# Patient Record
Sex: Female | Born: 1985 | Race: White | Hispanic: No | Marital: Single | Smoking: Current every day smoker
Health system: Southern US, Community
[De-identification: ages and names within clinical notes are randomized; demographics above are authoritative.]

## PROBLEM LIST (undated history)

## (undated) ENCOUNTER — Inpatient Hospital Stay: Payer: Self-pay

## (undated) DIAGNOSIS — F419 Anxiety disorder, unspecified: Secondary | ICD-10-CM

## (undated) DIAGNOSIS — F329 Major depressive disorder, single episode, unspecified: Secondary | ICD-10-CM

## (undated) DIAGNOSIS — O99323 Drug use complicating pregnancy, third trimester: Secondary | ICD-10-CM

## (undated) DIAGNOSIS — F32A Depression, unspecified: Secondary | ICD-10-CM

## (undated) DIAGNOSIS — F99 Mental disorder, not otherwise specified: Secondary | ICD-10-CM

## (undated) HISTORY — PX: NO PAST SURGERIES: SHX2092

---
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
## 2005-02-26 ENCOUNTER — Emergency Department: Payer: Self-pay | Admitting: Emergency Medicine

## 2006-06-25 ENCOUNTER — Emergency Department (HOSPITAL_COMMUNITY): Admission: EM | Admit: 2006-06-25 | Discharge: 2006-06-25 | Payer: Self-pay | Admitting: Emergency Medicine

## 2006-12-20 ENCOUNTER — Emergency Department: Payer: Self-pay | Admitting: Emergency Medicine

## 2007-04-10 ENCOUNTER — Emergency Department: Payer: Self-pay | Admitting: Emergency Medicine

## 2007-06-04 ENCOUNTER — Emergency Department (HOSPITAL_COMMUNITY): Admission: EM | Admit: 2007-06-04 | Discharge: 2007-06-04 | Payer: Self-pay | Admitting: Emergency Medicine

## 2007-06-09 ENCOUNTER — Emergency Department: Payer: Self-pay | Admitting: Emergency Medicine

## 2008-08-29 ENCOUNTER — Emergency Department: Payer: Self-pay | Admitting: Emergency Medicine

## 2011-01-23 ENCOUNTER — Emergency Department: Payer: Self-pay | Admitting: Internal Medicine

## 2011-01-27 ENCOUNTER — Emergency Department: Payer: Self-pay | Admitting: Emergency Medicine

## 2011-06-30 ENCOUNTER — Inpatient Hospital Stay: Payer: Self-pay | Admitting: Obstetrics and Gynecology

## 2011-07-08 ENCOUNTER — Emergency Department (HOSPITAL_COMMUNITY)
Admission: EM | Admit: 2011-07-08 | Discharge: 2011-07-09 | Disposition: A | Discharge status: Still In Hospital | Payer: Self-pay | Attending: Emergency Medicine | Admitting: Emergency Medicine

## 2011-07-08 DIAGNOSIS — R109 Unspecified abdominal pain: Secondary | ICD-10-CM | POA: Insufficient documentation

## 2011-07-08 DIAGNOSIS — N739 Female pelvic inflammatory disease, unspecified: Secondary | ICD-10-CM | POA: Insufficient documentation

## 2011-07-08 DIAGNOSIS — N898 Other specified noninflammatory disorders of vagina: Secondary | ICD-10-CM | POA: Insufficient documentation

## 2011-07-08 DIAGNOSIS — Z87891 Personal history of nicotine dependence: Secondary | ICD-10-CM | POA: Insufficient documentation

## 2011-07-08 DIAGNOSIS — R079 Chest pain, unspecified: Secondary | ICD-10-CM | POA: Insufficient documentation

## 2011-07-08 DIAGNOSIS — F1911 Other psychoactive substance abuse, in remission: Secondary | ICD-10-CM | POA: Insufficient documentation

## 2011-07-08 DIAGNOSIS — Z79899 Other long term (current) drug therapy: Secondary | ICD-10-CM | POA: Insufficient documentation

## 2011-07-08 LAB — COMPREHENSIVE METABOLIC PANEL
ALT: 37 U/L — ABNORMAL HIGH (ref 0–35)
AST: 36 U/L (ref 0–37)
Albumin: 3.8 g/dL (ref 3.5–5.2)
Alkaline Phosphatase: 93 U/L (ref 39–117)
Calcium: 10.5 mg/dL (ref 8.4–10.5)
GFR calc Af Amer: 52 mL/min — ABNORMAL LOW (ref >60)
Glucose, Bld: 93 mg/dL (ref 70–99)
Potassium: 4 mEq/L (ref 3.5–5.1)
Sodium: 139 mEq/L (ref 135–145)
Total Protein: 7.9 g/dL (ref 6.0–8.3)

## 2011-07-08 LAB — POCT PREGNANCY, URINE: Preg Test, Ur: NEGATIVE

## 2011-07-08 LAB — URINALYSIS, ROUTINE W REFLEX MICROSCOPIC
Bilirubin Urine: NEGATIVE
Ketones, ur: NEGATIVE mg/dL
Nitrite: NEGATIVE
Specific Gravity, Urine: 1.009 (ref 1.005–1.030)
pH: 7 (ref 5.0–8.0)

## 2011-07-08 LAB — POCT I-STAT TROPONIN I: Troponin i, poc: 0 ng/mL (ref 0.00–0.08)

## 2011-07-08 LAB — CBC
MCH: 29.7 pg (ref 26.0–34.0)
MCHC: 34.6 g/dL (ref 30.0–36.0)
Platelets: 525 10*3/uL — ABNORMAL HIGH (ref 150–400)
RDW: 12.9 % (ref 11.5–15.5)

## 2011-07-08 LAB — URINE MICROSCOPIC-ADD ON

## 2011-07-09 ENCOUNTER — Emergency Department (HOSPITAL_COMMUNITY): Payer: Self-pay

## 2011-07-09 ENCOUNTER — Encounter (HOSPITAL_COMMUNITY): Payer: Self-pay | Admitting: *Deleted

## 2011-07-09 ENCOUNTER — Inpatient Hospital Stay (HOSPITAL_COMMUNITY)
Admission: AD | Admit: 2011-07-09 | Discharge: 2011-07-13 | DRG: 759 | Disposition: A | Discharge status: Home / Self Care | Payer: Self-pay | Source: Ambulatory Visit | Attending: Obstetrics & Gynecology | Admitting: Obstetrics & Gynecology

## 2011-07-09 ENCOUNTER — Inpatient Hospital Stay (HOSPITAL_COMMUNITY): Payer: Self-pay

## 2011-07-09 DIAGNOSIS — A5619 Other chlamydial genitourinary infection: Secondary | ICD-10-CM | POA: Diagnosis present

## 2011-07-09 DIAGNOSIS — A5901 Trichomonal vulvovaginitis: Secondary | ICD-10-CM | POA: Diagnosis present

## 2011-07-09 DIAGNOSIS — N739 Female pelvic inflammatory disease, unspecified: Principal | ICD-10-CM | POA: Diagnosis present

## 2011-07-09 DIAGNOSIS — N949 Unspecified condition associated with female genital organs and menstrual cycle: Secondary | ICD-10-CM | POA: Diagnosis present

## 2011-07-09 LAB — WET PREP, GENITAL: Yeast Wet Prep HPF POC: NONE SEEN

## 2011-07-09 LAB — DIFFERENTIAL
Basophils Absolute: 0 10*3/uL (ref 0.0–0.1)
Eosinophils Absolute: 0.2 10*3/uL (ref 0.0–0.7)
Lymphs Abs: 4.2 10*3/uL — ABNORMAL HIGH (ref 0.7–4.0)
Monocytes Absolute: 2 10*3/uL — ABNORMAL HIGH (ref 0.1–1.0)
Neutro Abs: 15.9 10*3/uL — ABNORMAL HIGH (ref 1.7–7.7)

## 2011-07-09 LAB — CK TOTAL AND CKMB (NOT AT ARMC): Relative Index: INVALID (ref 0.0–2.5)

## 2011-07-09 MED ORDER — DOXYCYCLINE HYCLATE 100 MG IV SOLR
100.0000 mg | Freq: Two times a day (BID) | INTRAVENOUS | Status: DC
  Administered 2011-07-09: 100 mg via INTRAVENOUS
  Filled 2011-07-09: qty 100

## 2011-07-09 MED ORDER — SENNOSIDES-DOCUSATE SODIUM 8.6-50 MG PO TABS: 2.0000 | ORAL_TABLET | Freq: Every day | ORAL | Status: DC | PRN

## 2011-07-09 MED ORDER — BISACODYL 10 MG RE SUPP: 10.0000 mg | Freq: Every day | RECTAL | Status: DC | PRN

## 2011-07-09 MED ORDER — DOCUSATE SODIUM 100 MG PO CAPS
100.0000 mg | ORAL_CAPSULE | Freq: Every day | ORAL | Status: DC
  Administered 2011-07-09 – 2011-07-12 (×4): 100 mg via ORAL
  Filled 2011-07-09 (×4): qty 1

## 2011-07-09 MED ORDER — TECHNETIUM TO 99M ALBUMIN AGGREGATED
6.0000 | Freq: Once | INTRAVENOUS | Status: AC | PRN
  Administered 2011-07-09: 6 via INTRAVENOUS

## 2011-07-09 MED ORDER — CLINDAMYCIN PHOSPHATE 900 MG/50ML IV SOLN
900.0000 mg | Freq: Three times a day (TID) | INTRAVENOUS | Status: DC
  Administered 2011-07-09 – 2011-07-12 (×11): 900 mg via INTRAVENOUS
  Filled 2011-07-09 (×13): qty 50

## 2011-07-09 MED ORDER — INFLUENZA VIRUS VACC SPLIT PF IM SUSP
0.5000 mL | Freq: Once | INTRAMUSCULAR | Status: AC
  Administered 2011-07-10: 0.5 mL via INTRAMUSCULAR
  Filled 2011-07-09: qty 0.5

## 2011-07-09 MED ORDER — NICOTINE 14 MG/24HR TD PT24
14.0000 mg | MEDICATED_PATCH | Freq: Every day | TRANSDERMAL | Status: DC
  Administered 2011-07-09 – 2011-07-12 (×4): 14 mg via TRANSDERMAL
  Filled 2011-07-09 (×5): qty 1

## 2011-07-09 MED ORDER — IBUPROFEN 800 MG PO TABS
800.0000 mg | ORAL_TABLET | Freq: Three times a day (TID) | ORAL | Status: DC | PRN
  Administered 2011-07-11 – 2011-07-12 (×2): 800 mg via ORAL
  Filled 2011-07-09 (×2): qty 1

## 2011-07-09 MED ORDER — GENTAMICIN SULFATE 40 MG/ML IJ SOLN
100.0000 mg | INTRAVENOUS | Status: DC
  Administered 2011-07-09 – 2011-07-10 (×2): 100 mg via INTRAVENOUS
  Filled 2011-07-09 (×3): qty 2.5

## 2011-07-09 MED ORDER — DOXYCYCLINE HYCLATE 100 MG IV SOLR
100.0000 mg | Freq: Two times a day (BID) | INTRAVENOUS | Status: DC
  Filled 2011-07-09: qty 100

## 2011-07-09 MED ORDER — XENON XE 133 GAS
10.0000 | GAS_FOR_INHALATION | Freq: Once | RESPIRATORY_TRACT | Status: AC | PRN
  Administered 2011-07-09: 10 via RESPIRATORY_TRACT

## 2011-07-09 MED ORDER — ZOLPIDEM TARTRATE 5 MG PO TABS: 5.0000 mg | ORAL_TABLET | Freq: Every evening | ORAL | Status: DC | PRN

## 2011-07-09 MED ORDER — OXYCODONE-ACETAMINOPHEN 5-325 MG PO TABS
1.0000 | ORAL_TABLET | ORAL | Status: DC | PRN
  Administered 2011-07-09: 2 via ORAL
  Administered 2011-07-09 (×2): 1 via ORAL
  Administered 2011-07-09 – 2011-07-10 (×4): 2 via ORAL
  Administered 2011-07-10: 1 via ORAL
  Administered 2011-07-10 – 2011-07-11 (×4): 2 via ORAL
  Administered 2011-07-12: 1 via ORAL
  Administered 2011-07-12: 2 via ORAL
  Administered 2011-07-12: 1 via ORAL
  Administered 2011-07-13 (×2): 2 via ORAL
  Filled 2011-07-09: qty 1
  Filled 2011-07-09 (×2): qty 2
  Filled 2011-07-09: qty 1
  Filled 2011-07-09 (×7): qty 2
  Filled 2011-07-09: qty 1
  Filled 2011-07-09 (×2): qty 2
  Filled 2011-07-09 (×2): qty 1
  Filled 2011-07-09: qty 2

## 2011-07-09 MED ORDER — ONDANSETRON HCL 4 MG PO TABS
4.0000 mg | ORAL_TABLET | Freq: Four times a day (QID) | ORAL | Status: DC | PRN
  Administered 2011-07-09: 4 mg via ORAL
  Filled 2011-07-09: qty 1

## 2011-07-09 MED ORDER — METRONIDAZOLE 500 MG PO TABS
500.0000 mg | ORAL_TABLET | Freq: Two times a day (BID) | ORAL | Status: DC
  Administered 2011-07-09 – 2011-07-11 (×5): 500 mg via ORAL
  Filled 2011-07-09 (×7): qty 1

## 2011-07-09 MED ORDER — ONDANSETRON HCL 4 MG/2ML IJ SOLN
4.0000 mg | Freq: Four times a day (QID) | INTRAMUSCULAR | Status: DC | PRN
  Administered 2011-07-10 – 2011-07-11 (×4): 4 mg via INTRAVENOUS
  Filled 2011-07-09 (×4): qty 2

## 2011-07-09 MED ORDER — MENTHOL 3 MG MT LOZG: 1.0000 | LOZENGE | OROMUCOSAL | Status: DC | PRN

## 2011-07-09 MED ORDER — METRONIDAZOLE IN NACL 5-0.79 MG/ML-% IV SOLN
500.0000 mg | Freq: Three times a day (TID) | INTRAVENOUS | Status: DC
  Filled 2011-07-09: qty 100

## 2011-07-09 MED ORDER — GUAIFENESIN 100 MG/5ML PO SOLN: 15.0000 mL | ORAL | Status: DC | PRN

## 2011-07-09 MED ORDER — DEXTROSE IN LACTATED RINGERS 5 % IV SOLN
INTRAVENOUS | Status: DC
  Administered 2011-07-09 – 2011-07-12 (×7): via INTRAVENOUS

## 2011-07-09 MED ORDER — SIMETHICONE 80 MG PO CHEW: 80.0000 mg | CHEWABLE_TABLET | Freq: Four times a day (QID) | ORAL | Status: DC | PRN

## 2011-07-09 NOTE — Consults (Signed)
ANTIBIOTIC CONSULT NOTE - INITIAL  Pharmacy Consult for Gentamicin Indication: Possible TOA  Allergies  Allergen Reactions  . Penicillins Rash    Patient Measurements: Height: 5\' 3"  (160 cm) Weight: 107 lb (48.535 kg) IBW/kg (Calculated) : 52.4 kg Dosing weight: 49kg  Vital Signs: Temp: 97.5 F (36.4 C) (09/19 1230) Temp src: Oral (09/19 1230) BP: 98/63 mmHg (09/19 1230) Pulse Rate: 58  (09/19 1230)  Labs:  Basename 07/08/11 2252  WBC 22.3*  HGB 13.7  PLT 525*  LABCREA --  CREATININE 1.49*  CRCLEARANCE --   Calculated CrCl = 44 ml/min   Microbiology: Recent Results (from the past 720 hour(s))  WET PREP, GENITAL     Status: Abnormal   Collection Time   07/09/11 12:27 AM      Component Value Range Status Comment   Yeast, Wet Prep NONE SEEN  NONE SEEN  Final    Trich, Wet Prep MODERATE (*) NONE SEEN  Final    Clue Cells, Wet Prep NONE SEEN  NONE SEEN  Final    WBC, Wet Prep HPF POC TOO NUMEROUS TO COUNT (*) NONE SEEN  Final    Past Medical History: Recent admission to [facility] for PID and given IV antibiotics.  Pt discharged from outside hospital 07/05/11 on cipro and doxycycline.  Patient now presenting with continued pelvic pain and vaginal discharge.  Medications:  Was initially started on Doxycycline 100mg  IV q12h and Metronidazole 500mg  IV q8h. Patient has now been changed to Clindamycin 900mg  IV q8h and gentamicin with oral metronidazole.  Assessment: 25 yr old female with elevated SCr being started on gentamicin. Estimated Ke =0.141, Estimated Vd = 0.3L/kg  Goal of Therapy:  Gentamicin peak 6-8 mg/L and Trough < 1 mg/L  Plan:  Gentamicin 100 mg IV every 18 hrs  Check Scr with next labs in AM to re-evaluate renal function Will check gentamicin levels if continued > 72hr or clinically indicated.  Natasha Bence 07/09/2011,1:58 PM

## 2011-07-09 NOTE — Progress Notes (Signed)
Pt transferred to our service from Dr. Cherly Hensen. She was admitted 9/10-9/15 to [facility] for PID with possible TOA. IV abx included doxycycline, cefoxitin, Ancef, Flagyl. No cervical cultures were done at [facility].  Allergies  Allergen Reactions  . Penicillins Rash   Current Facility-Administered Medications on File Prior to Encounter  Medication Dose Route Frequency Provider Last Rate Last Dose  . technetium albumin aggregated (MAA) injection solution 6 milli Curie  6 milli Curie Intravenous Once PRN Medication Radiologist   6 milli Curie at 07/09/11 [phone]  . xenon xe 133 gas 10 milli Curie  10 milli Curie Inhalation Once PRN Medication Radiologist   10 milli Curie at 07/09/11 [phone]   No current outpatient prescriptions on file prior to encounter.   Filed Vitals:   07/09/11 0553 07/09/11 0730  BP: 108/66 98/64  Pulse: 72 64  Temp: 97.4 F (36.3 C) 97.6 F (36.4 C)  TempSrc: Oral Oral  Resp: 18 18  SpO2:  98%   NAD, pleasant Abd: no distention, mild tenderness lower quads, no guarding  CBC    Component Value Date/Time   WBC 22.3* 07/08/2011 2252   RBC 4.62 07/08/2011 2252   HGB 13.7 07/08/2011 2252   HCT 39.6 07/08/2011 2252   PLT 525* 07/08/2011 2252   MCV 85.7 07/08/2011 2252   MCH 29.7 07/08/2011 2252   MCHC 34.6 07/08/2011 2252   RDW 12.9 07/08/2011 2252   LYMPHSABS 4.2* 07/08/2011 2252   MONOABS 2.0* 07/08/2011 2252   EOSABS 0.2 07/08/2011 2252   BASOSABS 0.0 07/08/2011 2252    *RADIOLOGY REPORT*  Clinical Data: PID, unresponsive to therapy.  TRANSABDOMINAL AND TRANSVAGINAL ULTRASOUND OF PELVIS  Technique: Both transabdominal and transvaginal ultrasound  examinations of the pelvis were performed. Transabdominal technique  was performed for global imaging of the pelvis including uterus,  ovaries, adnexal regions, and pelvic cul-de-sac.  Comparison: None.  It was necessary to proceed with endovaginal exam following the  transabdominal exam to visualize the endometrium and adnexa.    Findings:  Uterus: Normal in size and appearance, measuring 6.3 x 4.0 x 4.8 cm  Endometrium: Normal in thickness and appearance, measuring 6 mm.  Right ovary: Normal appearance/no adnexal mass. The right ovary  measures 3.2 x 1.7 x 2.1 cm.  Left ovary: Normal appearance/no adnexal mass the left ovary  measures 3.5 x 2.4 x 2.0 cm.  Other findings: Trace free fluid.  IMPRESSION:  Normal study. No evidence of pelvic mass or other significant  abnormality.  Original Report Authenticated By: Waneta Martins, M.D.      Imp PID partially treated Plan change abx to gentamycin and clindamycin. D/C doxycycline, change to po Flagyl to cover trichomonas

## 2011-07-09 NOTE — Progress Notes (Signed)
Received from [facility] ER, presented there with pain lower abd left side x 1 week

## 2011-07-09 NOTE — H&P (Signed)
Kari Bailey is an 25 y.o. female. G1P1 SWF LMP 06/24/2011 now being admitted for further mgmt of PID. Pt was seen @ [facility] and transferred here due to ongoing pelvic pain and elev WBC (22K). Pt reports admission to [facility] form ~ 9/7-9/15 with IV antibiotic therapy and was discharged on cipro and doxycycline which she reports taking all med. Pt presented there with c/o pelvic pain. She is unsure regarding culture results. Last coitus 2-3 wks ago. (+) assoc N/V. (+) c/o persistent vaginal discharge. Pain is described as sharp, intermittent nonradiating and not completely relieved by percocet. (+) condom use. Wet prep @ [facility] showed moderate trichomoniasis  Pertinent Gynecological History: Menses: regular every 28 days without intermenstrual spotting Bleeding: none Contraception: condoms DES exposure: denies Blood transfusions: none Sexually transmitted diseases: no past history Previous GYN Procedures: none  Last mammogram: n/a Date: n/a Last pap: did not ask Date: n/a OB History: G1, P1   Menstrual History: Menarche age:n/a Patient's last menstrual period was 06/24/2011.    Past Medical History  Diagnosis Date  . No pertinent past medical history     Past Surgical History  Procedure Date  . No past surgeries     No family history on file.  Social History:  reports that she has been smoking.  She does not have any smokeless tobacco history on file. She reports that she uses illicit drugs (Cocaine and Marijuana). She reports that she does not drink alcohol.  Allergies:  Allergies  Allergen Reactions  . Penicillins Rash    Prescriptions prior to admission  Medication Sig Dispense Refill  . ciprofloxacin (CIPRO) 500 MG tablet Take 500 mg by mouth 2 (two) times daily.        Marland Kitchen doxycycline (VIBRAMYCIN) 100 MG capsule Take 100 mg by mouth 2 (two) times daily.        Marland Kitchen HYDROcodone-acetaminophen (VICODIN) 5-500 MG per tablet Take 1-2 tablets by mouth every 6 (six) hours  as needed. For pain         Review of Systems  Gastrointestinal: Positive for nausea, vomiting and abdominal pain.  All other systems reviewed and are negative.    Blood pressure 108/66, pulse 72, temperature 97.4 F (36.3 C), temperature source Oral, resp. rate 18, last menstrual period 06/24/2011. Physical Exam  Constitutional: She is oriented to person, place, and time. She appears well-developed and well-nourished.  HENT:  Head: Normocephalic and atraumatic.  Eyes: EOM are normal.  Neck: Neck supple.  Cardiovascular: Regular rhythm.   Respiratory: Breath sounds normal.  GI: Soft. Bowel sounds are normal. There is tenderness. There is no rebound.  Genitourinary: Uterus normal. Vaginal discharge found.       Malodorous yellow frothy d/c. Bilateral L>R adnexal trenderness, axial uterus, (+) CMT  Musculoskeletal: Normal range of motion. She exhibits no edema.  Neurological: She is alert and oriented to person, place, and time.  Skin: Skin is warm and dry.    Results for orders placed during the hospital encounter of 07/08/11 (from the past 24 hour(s))  URINALYSIS, ROUTINE W REFLEX MICROSCOPIC     Status: Abnormal   Collection Time   07/08/11 10:12 PM      Component Value Range   Color, Urine YELLOW  YELLOW    Appearance CLEAR  CLEAR    Specific Gravity, Urine 1.009  1.005 - 1.030    pH 7.0  5.0 - 8.0    Glucose, UA NEGATIVE  NEGATIVE (mg/dL)   Hgb urine dipstick TRACE (*)  NEGATIVE    Bilirubin Urine NEGATIVE  NEGATIVE    Ketones, ur NEGATIVE  NEGATIVE (mg/dL)   Protein, ur NEGATIVE  NEGATIVE (mg/dL)   Urobilinogen, UA 0.2  0.0 - 1.0 (mg/dL)   Nitrite NEGATIVE  NEGATIVE    Leukocytes, UA MODERATE (*) NEGATIVE   URINE MICROSCOPIC-ADD ON     Status: Abnormal   Collection Time   07/08/11 10:12 PM      Component Value Range   Squamous Epithelial / LPF MANY (*) RARE    WBC, UA 7-10  <3 (WBC/hpf)   RBC / HPF 3-6  <3 (RBC/hpf)   Bacteria, UA FEW (*) RARE    Urine-Other  TRICHOMONAS PRESENT    POCT PREGNANCY, URINE     Status: Normal   Collection Time   07/08/11 10:28 PM      Component Value Range   Preg Test, Ur NEGATIVE    DIFFERENTIAL     Status: Abnormal   Collection Time   07/08/11 10:52 PM      Component Value Range   Neutrophils Relative 71  43 - 77 (%)   Lymphocytes Relative 19  12 - 46 (%)   Monocytes Relative 9  3 - 12 (%)   Eosinophils Relative 1  0 - 5 (%)   Basophils Relative 0  0 - 1 (%)   Neutro Abs 15.9 (*) 1.7 - 7.7 (K/uL)   Lymphs Abs 4.2 (*) 0.7 - 4.0 (K/uL)   Monocytes Absolute 2.0 (*) 0.1 - 1.0 (K/uL)   Eosinophils Absolute 0.2  0.0 - 0.7 (K/uL)   Basophils Absolute 0.0  0.0 - 0.1 (K/uL)   RBC Morphology TEARDROP CELLS    CBC     Status: Abnormal   Collection Time   07/08/11 10:52 PM      Component Value Range   WBC 22.3 (*) 4.0 - 10.5 (K/uL)   RBC 4.62  3.87 - 5.11 (MIL/uL)   Hemoglobin 13.7  12.0 - 15.0 (g/dL)   HCT 85.4  62.7 - 03.5 (%)   MCV 85.7  78.0 - 100.0 (fL)   MCH 29.7  26.0 - 34.0 (pg)   MCHC 34.6  30.0 - 36.0 (g/dL)   RDW 00.9  38.1 - 82.9 (%)   Platelets 525 (*) 150 - 400 (K/uL)  COMPREHENSIVE METABOLIC PANEL     Status: Abnormal   Collection Time   07/08/11 10:52 PM      Component Value Range   Sodium 139  135 - 145 (mEq/L)   Potassium 4.0  3.5 - 5.1 (mEq/L)   Chloride 101  96 - 112 (mEq/L)   CO2 23  19 - 32 (mEq/L)   Glucose, Bld 93  70 - 99 (mg/dL)   BUN 21  6 - 23 (mg/dL)   Creatinine, Ser 9.37 (*) 0.50 - 1.10 (mg/dL)   Calcium 16.9  8.4 - 10.5 (mg/dL)   Total Protein 7.9  6.0 - 8.3 (g/dL)   Albumin 3.8  3.5 - 5.2 (g/dL)   AST 36  0 - 37 (U/L)   ALT 37 (*) 0 - 35 (U/L)   Alkaline Phosphatase 93  39 - 117 (U/L)   Total Bilirubin 0.3  0.3 - 1.2 (mg/dL)   GFR calc non Af Amer 43 (*) >60 (mL/min)   GFR calc Af Amer 52 (*) >60 (mL/min)  CK TOTAL AND CKMB     Status: Normal   Collection Time   07/08/11 10:52 PM      Component  Value Range   Total CK 35  7 - 177 (U/L)   CK, MB 1.1  0.3 - 4.0 (ng/mL)    Relative Index RELATIVE INDEX IS INVALID  0.0 - 2.5   POCT I-STAT TROPONIN I     Status: Normal   Collection Time   07/08/11 11:24 PM      Component Value Range   Troponin i, poc 0.00  0.00 - 0.08 (ng/mL)   Comment 3           WET PREP, GENITAL     Status: Abnormal   Collection Time   07/09/11 12:27 AM      Component Value Range   Yeast, Wet Prep NONE SEEN  NONE SEEN    Trich, Wet Prep MODERATE (*) NONE SEEN    Clue Cells, Wet Prep NONE SEEN  NONE SEEN    WBC, Wet Prep HPF POC TOO NUMEROUS TO COUNT (*) NONE SEEN   POCT I-STAT TROPONIN I     Status: Normal   Collection Time   07/09/11  2:47 AM      Component Value Range   Troponin i, poc 0.00  0.00 - 0.08 (ng/mL)   Comment 3             Dg Chest 1 View  07/09/2011  *RADIOLOGY REPORT*  Clinical Data: Chest pain  CHEST - 1 VIEW  Comparison: 06/04/2007  Findings: Lungs are clear. No pleural effusion or pneumothorax. The cardiomediastinal contours are within normal limits. The visualized bones and soft tissues are without significant appreciable abnormality.  IMPRESSION: No acute cardiac or process.  Original Report Authenticated By: Waneta Martins, M.D.   Nm Pulmonary Per & Vent  07/09/2011  *RADIOLOGY REPORT*  Clinical data:  Chest pain  VENTILATION PERFUSION SCINTIGRAPHY  Findings: Ventilation imaging with 10. Xe-133 inhaled shows homogeneous distribution throughout both lungs with no significant retention on the washout images.  Perfusion imaging with 6. Tc79m MAA IV shows a homogeneous distribution throughout the lungs with no discrete segmental or subsegmental perfusion defects.  IMPRESSION 1. Negative for pulmonary embolism.  Original Report Authenticated By: Waneta Martins, M.D.    Assessment/Plan: PID  R/o abscess Trichomoniasis P) Admit to faculty practice. 2) IV antibiotics. Obtain records from [place] 4)pelvic sonogram. Pain mgmt . Start Doxycycline/ Flagyl pending more detail  Kimyata Milich  A 07/09/2011, 6:26 AM

## 2011-07-10 DIAGNOSIS — N739 Female pelvic inflammatory disease, unspecified: Secondary | ICD-10-CM

## 2011-07-10 DIAGNOSIS — A5619 Other chlamydial genitourinary infection: Secondary | ICD-10-CM

## 2011-07-10 DIAGNOSIS — A5901 Trichomonal vulvovaginitis: Secondary | ICD-10-CM

## 2011-07-10 DIAGNOSIS — N949 Unspecified condition associated with female genital organs and menstrual cycle: Secondary | ICD-10-CM

## 2011-07-10 LAB — BASIC METABOLIC PANEL
GFR calc Af Amer: >60 mL/min (ref >60)
GFR calc non Af Amer: >60 mL/min (ref >60)
Potassium: 3.8 mEq/L (ref 3.5–5.1)
Sodium: 141 mEq/L (ref 135–145)

## 2011-07-10 LAB — CBC
Hemoglobin: 11.2 g/dL — ABNORMAL LOW (ref 12.0–15.0)
MCHC: 33 g/dL (ref 30.0–36.0)
RDW: 13.2 % (ref 11.5–15.5)

## 2011-07-10 LAB — GC/CHLAMYDIA PROBE AMP, GENITAL
Chlamydia, DNA Probe: POSITIVE — AB
GC Probe Amp, Genital: NEGATIVE

## 2011-07-10 MED ORDER — GENTAMICIN SULFATE 40 MG/ML IJ SOLN
100.0000 mg | Freq: Three times a day (TID) | INTRAVENOUS | Status: DC
  Administered 2011-07-10 – 2011-07-13 (×7): 100 mg via INTRAVENOUS
  Filled 2011-07-10 (×10): qty 2.5

## 2011-07-10 NOTE — Progress Notes (Signed)
Subjective:Hosp day 3 for suspected PID,after readmit from recent hosp in [place]     Patient reports tolerating PO. No bm since admit. Pain is mild , near navel, She denies  Suprapubic pain today. Pt perceives self as improved  Objective: I have reviewed patient's vital signs, medications and labs.wbc normal at 7,500. ESR normal at 22    General: alert, cooperative and no distress GI: soft, non-tender; bowel sounds normal; no masses,  no organomegaly and suprapubic area specifically nontender   Assessment/Plan:  Hosp day 3 for suspected PID, resolved leukocytosis, without abscess  Plan Discharge home in am  On Doxycycline 100 bid x 7ays, Flagyl 500 bid x 7 days for followup ? [place] 2 week  LOS: 1 day      Kari Bailey V 07/10/2011, 11:01 AM

## 2011-07-10 NOTE — Consult Note (Signed)
ANTIBIOTIC CONSULT NOTE - FOLLOW UP  Pharmacy Consult for Gentamicin Indication: r/o TOA  Allergies  Allergen Reactions  . Penicillins Rash    Patient Measurements: Height: 5\' 3"  (160 cm) Weight: 107 lb (48.535 kg) IBW/kg (Calculated) : 52.4   Vital Signs: Temp: 98.2 F (36.8 C) (09/20 1200) Temp src: Oral (09/20 1200) BP: 100/62 mmHg (09/20 1200) Pulse Rate: 68  (09/20 1200) Intake/Output from previous day: 09/19 0701 - 09/20 0700 In: 2470 [P.O.:1560; I.V.:910] Out: 1850 [Urine:1850] Intake/Output from this shift: Total I/O In: 240 [P.O.:240] Out: 1250 [Urine:1250]  Labs:  [facility] 07/10/11 0525 07/08/11 2252  WBC 7.5 22.3*  HGB 11.2* 13.7  PLT 397 525*  LABCREA -- --  CREATININE 0.70 1.49*  CRCLEARANCE -- --      Microbiology: Recent Results (from the past 720 hour(s))  WET PREP, GENITAL     Status: Abnormal   Collection Time   07/09/11 12:27 AM      Component Value Range Status Comment   Yeast, Wet Prep NONE SEEN  NONE SEEN  Final    Trich, Wet Prep MODERATE (*) NONE SEEN  Final    Clue Cells, Wet Prep NONE SEEN  NONE SEEN  Final    WBC, Wet Prep HPF POC TOO NUMEROUS TO COUNT (*) NONE SEEN  Final   CULTURE, BLOOD (ROUTINE X 2)     Status: Normal (Preliminary result)   Collection Time   07/09/11 12:45 AM      Component Value Range Status Comment   Specimen Description BLOOD RIGHT HAND   Final    Special Requests BOTTLES DRAWN AEROBIC AND ANAEROBIC 10CC   Final    Setup Time [id number]   Final    Culture     Final    Value:        BLOOD CULTURE RECEIVED NO GROWTH TO DATE CULTURE WILL BE HELD FOR 5 DAYS BEFORE ISSUING A FINAL NEGATIVE REPORT   Report Status PENDING   Incomplete   CULTURE, BLOOD (ROUTINE X 2)     Status: Normal (Preliminary result)   Collection Time   07/09/11  1:15 AM      Component Value Range Status Comment   Specimen Description BLOOD RIGHT ARM   Final    Special Requests BOTTLES DRAWN AEROBIC AND ANAEROBIC 10CC   Final    Setup  Time [id number]   Final    Culture     Final    Value:        BLOOD CULTURE RECEIVED NO GROWTH TO DATE CULTURE WILL BE HELD FOR 5 DAYS BEFORE ISSUING A FINAL NEGATIVE REPORT   Report Status PENDING   Incomplete      Assessment: Day 2 of clindamycin 900 q8h and gentamicin 100 mg q18h.  Scr has improved 0.7 (1.49 on 9/19).  Will adjust gentamicin dose to 100 mg IV q8h.  Will be mixed with Clindamycin.    Goal of Therapy:  Gentamicin peak 6-8, gentamicin trough < 1  Plan:  Day 2 of clindamycin 900 q8h and gentamicin 100 mg q18h.  Scr has improved 0.7 (1.49 on 9/19).  Will adjust gentamicin dose to 100 mg IV q8h.  Will be mixed with Clindamycin.    Kari Bailey, Loma Messing 07/10/2011,4:01 PM

## 2011-07-11 MED ORDER — INFLUENZA VAC TYP A&B SURF ANT IM INJ: 0.5000 mL | INJECTION | Freq: Once | INTRAMUSCULAR | Status: DC

## 2011-07-11 MED ORDER — INFLUENZA VIRUS VACC SPLIT PF IM SUSP
0.5000 mL | Freq: Once | INTRAMUSCULAR | Status: DC
  Filled 2011-07-11: qty 0.5

## 2011-07-11 NOTE — Progress Notes (Signed)
Subjective: Patient reports nausea and vomiting.  Some pressure during voiding, bad taste in her mouth. Still some tenderness in abd.  Objective: I have reviewed patient's vital signs, medications and labs. Filed Vitals:   07/11/11 1207  BP: 90/48  Pulse: 65  Temp: 97.6 F (36.4 C)  Resp: 18   Chlamydia pos General: alert, cooperative and no distress GI: abnormal findings:  mild tenderness in all quadrants CBC    Component Value Date/Time   WBC 7.5 07/10/2011 0525   RBC 3.82* 07/10/2011 0525   HGB 11.2* 07/10/2011 0525   HCT 33.9* 07/10/2011 0525   PLT 397 07/10/2011 0525   MCV 88.7 07/10/2011 0525   MCH 29.3 07/10/2011 0525   MCHC 33.0 07/10/2011 0525   RDW 13.2 07/10/2011 0525   LYMPHSABS 4.2* 07/08/2011 2252   MONOABS 2.0* 07/08/2011 2252   EOSABS 0.2 07/08/2011 2252   BASOSABS 0.0 07/08/2011 2252      Assessment/Plan: Improved with mild tenderness noted, IV abx for PID. Chlamydia, trichomonas positive Rec clindamycin for D/C med. Stop Flagyl as it is causing N&V  LOS: 2 days    ARNOLD,JAMES 07/11/2011, 3:40 PM

## 2011-07-11 NOTE — Progress Notes (Signed)
UR Chart review completed.  

## 2011-07-12 NOTE — Progress Notes (Signed)
Subjective: Interval History: Pain is improved, tolerating po, ambulatory  Objective: Vital signs in last 24 hours: Temp:  [97.6 F (36.4 C)-98.9 F (37.2 C)] 98.9 F (37.2 C) (09/22 0520) Pulse Rate:  [65-82] 65  (09/22 0520) Resp:  [18] 18  (09/22 0520) BP: (90-116)/(48-76) 90/52 mmHg (09/22 0520) SpO2:  [97 %-99 %] 97 % (09/22 0520)  Intake/Output from previous day: 09/21 0701 - 09/22 0700 In: 4016.4 [P.O.:2160; I.V.:1756.4] Out: 3850 [Urine:3850] Intake/Output this shift:    General appearance: alert, cooperative and no distress Abdomen: abnormal findings:  soft flat but continues with tenderness in both lower quadrants Extremities: extremities normal, atraumatic, no cyanosis or edema  No results found for this or any previous visit (from the past 24 hour(s)).  Studies/Results: Dg Chest 1 View  07/09/2011  *RADIOLOGY REPORT*  Clinical Data: Chest pain  CHEST - 1 VIEW  Comparison: 06/04/2007  Findings: Lungs are clear. No pleural effusion or pneumothorax. The cardiomediastinal contours are within normal limits. The visualized bones and soft tissues are without significant appreciable abnormality.  IMPRESSION: No acute cardiac or process.  Original Report Authenticated By: Waneta Martins, M.D.   US Transvaginal Non-ob  07/09/2011  *RADIOLOGY REPORT*  Clinical Data: PID, unresponsive to therapy.  TRANSABDOMINAL AND TRANSVAGINAL ULTRASOUND OF PELVIS Technique:  Both transabdominal and transvaginal ultrasound examinations of the pelvis were performed. Transabdominal technique was performed for global imaging of the pelvis including uterus, ovaries, adnexal regions, and pelvic cul-de-sac.  Comparison: None.   It was necessary to proceed with endovaginal exam following the transabdominal exam to visualize the endometrium and adnexa.  Findings:  Uterus: Normal in size and appearance, measuring 6.3 x 4.0 x 4.8 cm  Endometrium: Normal in thickness and appearance, measuring 6 mm.  Right  ovary:  Normal appearance/no adnexal mass.  The right ovary measures 3.2 x 1.7 x 2.1 cm.  Left ovary: Normal appearance/no adnexal mass the left ovary measures 3.5 x 2.4 x 2.0 cm.  Other findings: Trace free fluid.  IMPRESSION: Normal study. No evidence of pelvic mass or other significant abnormality.  Original Report Authenticated By: Waneta Martins, M.D.   US Pelvis Complete  07/09/2011  *RADIOLOGY REPORT*  Clinical Data: PID, unresponsive to therapy.  TRANSABDOMINAL AND TRANSVAGINAL ULTRASOUND OF PELVIS Technique:  Both transabdominal and transvaginal ultrasound examinations of the pelvis were performed. Transabdominal technique was performed for global imaging of the pelvis including uterus, ovaries, adnexal regions, and pelvic cul-de-sac.  Comparison: None.   It was necessary to proceed with endovaginal exam following the transabdominal exam to visualize the endometrium and adnexa.  Findings:  Uterus: Normal in size and appearance, measuring 6.3 x 4.0 x 4.8 cm  Endometrium: Normal in thickness and appearance, measuring 6 mm.  Right ovary:  Normal appearance/no adnexal mass.  The right ovary measures 3.2 x 1.7 x 2.1 cm.  Left ovary: Normal appearance/no adnexal mass the left ovary measures 3.5 x 2.4 x 2.0 cm.  Other findings: Trace free fluid.  IMPRESSION: Normal study. No evidence of pelvic mass or other significant abnormality.  Original Report Authenticated By: Waneta Martins, M.D.   Nm Pulmonary Per & Vent  07/09/2011  *RADIOLOGY REPORT*  Clinical data:  Chest pain  VENTILATION PERFUSION SCINTIGRAPHY  Findings: Ventilation imaging with 10. Xe-133 inhaled shows homogeneous distribution throughout both lungs with no significant retention on the washout images.  Perfusion imaging with 6. Tc73m MAA IV shows a homogeneous distribution throughout the lungs with no discrete segmental or subsegmental perfusion  defects.  IMPRESSION 1. Negative for pulmonary embolism.  Original Report  Authenticated By: Waneta Martins, M.D.    Scheduled Meds:   . clindamycin (CLEOCIN) IV  900 mg Intravenous Q8H  . docusate sodium  100 mg Oral Daily  . gentamicin  100 mg Intravenous Q8H  . influenza  inactive virus vaccine  0.5 mL Intramuscular Once  . nicotine  14 mg Transdermal Daily  . DISCONTD: influenza (>/= 3 years) inactive virus vaccine  0.5 mL Intramuscular Once  . DISCONTD: metroNIDAZOLE  500 mg Oral Q12H   Continuous Infusions:   . dextrose 5% lactated ringers 100 mL/hr at 07/12/11 0600   PRN Meds:bisacodyl, guaiFENesin, ibuprofen, menthol-cetylpyridinium, ondansetron (ZOFRAN) IV, ondansetron, oxyCODONE-acetaminophen, senna-docusate, simethicone, zolpidem  Assessment/Plan: PID:  Subjectively and objectively responding to antibiotics.  Culture with positive chlamydia.  Still however tender and with this being 2nd admission will keep today and anticipate discharge tomorrow.  Probably discharge on Cipro + Doxy    LOS: 3 days   Makaia Rappa H

## 2011-07-13 DIAGNOSIS — N949 Unspecified condition associated with female genital organs and menstrual cycle: Secondary | ICD-10-CM

## 2011-07-13 DIAGNOSIS — A5901 Trichomonal vulvovaginitis: Secondary | ICD-10-CM

## 2011-07-13 DIAGNOSIS — N739 Female pelvic inflammatory disease, unspecified: Principal | ICD-10-CM

## 2011-07-13 DIAGNOSIS — A5619 Other chlamydial genitourinary infection: Secondary | ICD-10-CM

## 2011-07-13 MED ORDER — MEDROXYPROGESTERONE ACETATE 150 MG/ML IM SUSP
150.0000 mg | Freq: Once | INTRAMUSCULAR | Status: AC
  Administered 2011-07-13: 150 mg via INTRAMUSCULAR
  Filled 2011-07-13: qty 1

## 2011-07-13 MED ORDER — IBUPROFEN 800 MG PO TABS: 800.0000 mg | ORAL_TABLET | Freq: Three times a day (TID) | ORAL | Status: AC | PRN

## 2011-07-13 NOTE — Progress Notes (Signed)
Subjective: Patient reports tolerating PO and no problems voiding, ready to go home, pain is much decreased.  She would like to restart depo provera before she leaves.    Objective: I have reviewed patient's vital signs, intake and output, medications, labs, microbiology and radiology results.  General: alert and cooperative Resp: clear to auscultation bilaterally Cardio: regular rate and rhythm, S1, S2 normal, no murmur, click, rub or gallop GI: soft, non-tender; bowel sounds normal; no masses,  no organomegaly Extremities: extremities normal, atraumatic, no cyanosis or edema   Assessment/Plan: 2nd admission for PID- now AF since admission, normal U/S and WBC. Pain subjectively improved. Discharge home. She will follow up at [facility]. Her FWB is aware and will be treated.-  LOS: 4 days    Kari Bailey C. 07/13/2011, 7:10 AM

## 2011-07-13 NOTE — Progress Notes (Signed)
D/c instructions discussed with pt., pt states understanding of same.  No home equipment needed, ambulated to car with staff without incident, d/c'd home with family.

## 2011-07-13 NOTE — Discharge Summary (Signed)
Physician Discharge Summary  Patient ID: Kari Bailey MRN: [id number] DOB/AGE: [birth year] 25 y.o.  Admit date: 07/09/2011 Discharge date: 07/13/2011  Admission Diagnoses: PID with Chlamydia and Trichomoniasis  Discharge Diagnoses: same  Hospital Course: She had been admitted at [facility] for a week and was discharged on Saturday.  By Tuesday the 19th her pain had returned and she was advised to come to the [facility].  She was readmitted based on her pain.  Her U/S was normal.  She did complain of some chest pain and a cardiac workup was negative.  Her chest pain resolved.  By the 23rd, she felt ready to go home.  Discharge Exam: Blood pressure 90/50, pulse 95, temperature 97.5 F (36.4 C), temperature source Oral, resp. rate 18, height 5\' 3"  (1.6 m), weight 48.535 kg (107 lb), last menstrual period 06/24/2011, SpO2 99.00%. General appearance: alert Resp: clear to auscultation bilaterally GI: soft, non-tender; bowel sounds normal; no masses,  no organomegaly  Disposition: home Follow up: at the [facility] in 2-3 weeks/prn sooner. Diet: as tolerated   Current Discharge Medication List    CONTINUE these medications which have NOT CHANGED   Details  ciprofloxacin (CIPRO) 500 MG tablet Take 500 mg by mouth 2 (two) times daily.      doxycycline (VIBRAMYCIN) 100 MG capsule Take 100 mg by mouth 2 (two) times daily.                Signed: Aranda Bihm C. 07/13/2011, 7:15 AM

## 2011-07-15 LAB — CULTURE, BLOOD (ROUTINE X 2)
Culture  Setup Time: 201209190831
Culture: NO GROWTH

## 2011-08-01 LAB — URINALYSIS, ROUTINE W REFLEX MICROSCOPIC
Bilirubin Urine: NEGATIVE
Nitrite: NEGATIVE
Specific Gravity, Urine: 1.021
pH: 6

## 2011-08-01 LAB — URINE MICROSCOPIC-ADD ON

## 2011-09-03 ENCOUNTER — Encounter: Payer: Self-pay | Admitting: Obstetrics & Gynecology

## 2012-03-18 ENCOUNTER — Emergency Department: Payer: Self-pay | Admitting: Emergency Medicine

## 2012-03-18 LAB — URINALYSIS, COMPLETE
Bilirubin,UR: NEGATIVE
Glucose,UR: NEGATIVE mg/dL (ref 0–75)
Ph: 5 (ref 4.5–8.0)
Protein: 30
Squamous Epithelial: 2
WBC UR: 11 /HPF (ref 0–5)

## 2012-07-28 IMAGING — NM NM PULMONARY VENT & PERF
2 series · 12 of 12 positions shown · non-contrast
Comparison: none

CLINICAL DATA: Chest pain

VENTILATION PERFUSION SCINTIGRAPHY

[vq scan · 2.52mm/px · 6 of 20 frames shown (1 of 2)]
[frame 2/20  full-range]
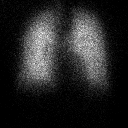
[frame 5/20  full-range]
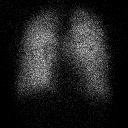
[frame 9/20  full-range]
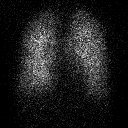
[frame 12/20  full-range]
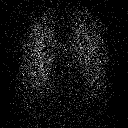
[frame 15/20]
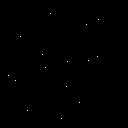
[frame 19/20]
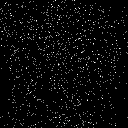

[vq scan · 2.52mm/px · 6 of 20 frames shown (2 of 2)]
[frame 2/20  full-range]
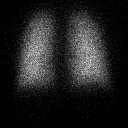
[frame 5/20  full-range]
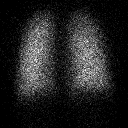
[frame 9/20  full-range]
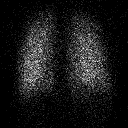
[frame 12/20  full-range]
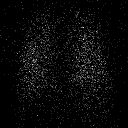
[frame 15/20]
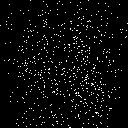
[frame 19/20]
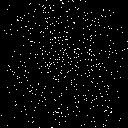

[12 of 12 positions shown; findings below may reference images not displayed]

FINDINGS: Ventilation imaging with H9.9mDi Ue-255 inhaled shows
homogeneous distribution throughout both lungs with no significant
retention on the washout images.

Perfusion imaging with Q.9m1i 8cSSm MAA IV shows a homogeneous
distribution throughout the lungs with no discrete segmental or
subsegmental perfusion defects.

IMPRESSION
1. Negative for pulmonary embolism.

## 2012-09-22 ENCOUNTER — Emergency Department: Payer: Self-pay | Admitting: Emergency Medicine

## 2012-09-22 LAB — COMPREHENSIVE METABOLIC PANEL
Albumin: 4.4 g/dL (ref 3.4–5.0)
Anion Gap: 7 (ref 7–16)
Calcium, Total: 9.3 mg/dL (ref 8.5–10.1)
Chloride: 106 mmol/L (ref 98–107)
Co2: 25 mmol/L (ref 21–32)
Glucose: 103 mg/dL — ABNORMAL HIGH (ref 65–99)
Potassium: 3.6 mmol/L (ref 3.5–5.1)
SGOT(AST): 22 U/L (ref 15–37)

## 2012-09-22 LAB — URINALYSIS, COMPLETE
Bilirubin,UR: NEGATIVE
Glucose,UR: NEGATIVE mg/dL (ref 0–75)
Ketone: NEGATIVE
Leukocyte Esterase: NEGATIVE
Nitrite: NEGATIVE
Ph: 6 (ref 4.5–8.0)
Protein: NEGATIVE
RBC,UR: 3 /HPF (ref 0–5)
Specific Gravity: 1.012 (ref 1.003–1.030)
Squamous Epithelial: 2
WBC UR: 2 /HPF (ref 0–5)

## 2012-09-22 LAB — CBC
HCT: 40.7 % (ref 35.0–47.0)
HGB: 14.2 g/dL (ref 12.0–16.0)
MCH: 31.6 pg (ref 26.0–34.0)
MCHC: 34.9 g/dL (ref 32.0–36.0)
MCV: 90 fL (ref 80–100)
Platelet: 324 10*3/uL (ref 150–440)
RBC: 4.5 10*6/uL (ref 3.80–5.20)
RDW: 13.4 % (ref 11.5–14.5)
WBC: 12.6 10*3/uL — ABNORMAL HIGH (ref 3.6–11.0)

## 2013-02-22 ENCOUNTER — Emergency Department: Payer: Self-pay | Admitting: Emergency Medicine

## 2013-05-03 ENCOUNTER — Emergency Department: Payer: Self-pay | Admitting: Emergency Medicine

## 2013-05-05 LAB — BETA STREP CULTURE(ARMC)

## 2014-01-14 ENCOUNTER — Inpatient Hospital Stay: Payer: Self-pay | Admitting: Obstetrics and Gynecology

## 2014-01-14 LAB — CBC WITH DIFFERENTIAL/PLATELET
BASOS PCT: 0.7 %
Basophil #: 0.1 10*3/uL (ref 0.0–0.1)
EOS ABS: 0.1 10*3/uL (ref 0.0–0.7)
Eosinophil %: 0.6 %
HCT: 40.3 % (ref 35.0–47.0)
HGB: 13.6 g/dL (ref 12.0–16.0)
LYMPHS PCT: 12.8 %
Lymphocyte #: 2.2 10*3/uL (ref 1.0–3.6)
MCH: 30.6 pg (ref 26.0–34.0)
MCHC: 33.8 g/dL (ref 32.0–36.0)
MCV: 91 fL (ref 80–100)
Monocyte #: 0.9 x10 3/mm (ref 0.2–0.9)
Monocyte %: 5.1 %
NEUTROS ABS: 13.9 10*3/uL — AB (ref 1.4–6.5)
Neutrophil %: 80.8 %
Platelet: 191 10*3/uL (ref 150–440)
RBC: 4.45 10*6/uL (ref 3.80–5.20)
RDW: 13.2 % (ref 11.5–14.5)
WBC: 17.2 10*3/uL — AB (ref 3.6–11.0)

## 2014-01-15 LAB — DRUG SCREEN, URINE
Amphetamines, Ur Screen: NEGATIVE (ref ?–1000)
Barbiturates, Ur Screen: NEGATIVE (ref ?–200)
Benzodiazepine, Ur Scrn: NEGATIVE (ref ?–200)
Cannabinoid 50 Ng, Ur ~~LOC~~: NEGATIVE (ref ?–50)
Cocaine Metabolite,Ur ~~LOC~~: NEGATIVE (ref ?–300)
MDMA (ECSTASY) UR SCREEN: NEGATIVE (ref ?–500)
Methadone, Ur Screen: NEGATIVE (ref ?–300)
Opiate, Ur Screen: NEGATIVE (ref ?–300)
PHENCYCLIDINE (PCP) UR S: NEGATIVE (ref ?–25)
Tricyclic, Ur Screen: NEGATIVE (ref ?–1000)

## 2014-01-15 LAB — GC/CHLAMYDIA PROBE AMP

## 2014-01-15 LAB — HEMATOCRIT: HCT: 37.9 % (ref 35.0–47.0)

## 2014-01-30 ENCOUNTER — Emergency Department: Payer: Self-pay | Admitting: Emergency Medicine

## 2014-02-08 ENCOUNTER — Emergency Department: Payer: Self-pay | Admitting: Emergency Medicine

## 2014-08-21 ENCOUNTER — Encounter (HOSPITAL_COMMUNITY): Payer: Self-pay | Admitting: *Deleted

## 2014-11-24 ENCOUNTER — Emergency Department: Payer: Self-pay | Admitting: Emergency Medicine

## 2014-11-26 LAB — BETA STREP CULTURE(ARMC)

## 2015-02-27 NOTE — H&P (Signed)
L&D Evaluation:  History:  HPI Pt is a 29 yo G2P1001 pt of the health department at [redacted] weeks GA by first timester u/s who presents to L&D with contractions. She reports that they started this am. [facility] and was sent home in "early labor". She reports that the contractions got worse and she came here. She has a history of PPD, anxiety, +THC in pregnancy, smoker. Her prenatal labs are O-, RI, VI, Hep B -, HIV-, GBS-.   Presents with contractions   Patient's Medical History anxiety, PPD, PID requiring hospitalization from chlamydia, ASCUS pap   Patient's Surgical History none   Medications Pre Natal Vitamins   Allergies PCN   Social History tobacco  drugs  MJ   Family History Non-Contributory   ROS:  ROS All systems were reviewed.  HEENT, CNS, GI, GU, Respiratory, CV, Renal and Musculoskeletal systems were found to be normal.   Exam:  Vital Signs stable   General no apparent distress   Mental Status clear   Chest clear   Heart normal sinus rhythm   Abdomen gravid, tender with contractions   Mebranes Ruptured   Description clear   FHT normal rate with no decels   Ucx regular   Skin dry, no lesions   Impression:  Impression active labor   Plan:  Plan EFM/NST, monitor contractions and for cervical change, UDS, GC/CT   Follow Up Appointment need to schedule   Electronic Signatures: Jannet MantisSubudhi, Damonie Ellenwood (CNM)  (Signed 28-Mar-15 21:23)  Authored: L&D Evaluation   Last Updated: 28-Mar-15 21:23 by Jannet MantisSubudhi, Odell Choung (CNM)

## 2015-05-15 ENCOUNTER — Observation Stay
Admission: EM | Admit: 2015-05-15 | Discharge: 2015-05-15 | Disposition: A | Discharge status: Home / Self Care | Payer: Medicaid Other | Attending: Obstetrics and Gynecology | Admitting: Obstetrics and Gynecology

## 2015-05-15 ENCOUNTER — Emergency Department: Payer: Medicaid Other

## 2015-05-15 ENCOUNTER — Encounter: Payer: Self-pay | Admitting: Emergency Medicine

## 2015-05-15 DIAGNOSIS — R109 Unspecified abdominal pain: Secondary | ICD-10-CM | POA: Diagnosis not present

## 2015-05-15 DIAGNOSIS — Z3A22 22 weeks gestation of pregnancy: Secondary | ICD-10-CM | POA: Insufficient documentation

## 2015-05-15 DIAGNOSIS — R1030 Lower abdominal pain, unspecified: Secondary | ICD-10-CM

## 2015-05-15 DIAGNOSIS — O26899 Other specified pregnancy related conditions, unspecified trimester: Secondary | ICD-10-CM

## 2015-05-15 DIAGNOSIS — O26892 Other specified pregnancy related conditions, second trimester: Principal | ICD-10-CM | POA: Insufficient documentation

## 2015-05-15 DIAGNOSIS — O9989 Other specified diseases and conditions complicating pregnancy, childbirth and the puerperium: Secondary | ICD-10-CM | POA: Diagnosis present

## 2015-05-15 HISTORY — DX: Mental disorder, not otherwise specified: F99

## 2015-05-15 LAB — COMPREHENSIVE METABOLIC PANEL
ALBUMIN: 3.1 g/dL — AB (ref 3.5–5.0)
ALT: 8 U/L — AB (ref 14–54)
ANION GAP: 8 (ref 5–15)
AST: 17 U/L (ref 15–41)
Alkaline Phosphatase: 60 U/L (ref 38–126)
BUN: 5 mg/dL — AB (ref 6–20)
CO2: 22 mmol/L (ref 22–32)
CREATININE: 0.42 mg/dL — AB (ref 0.44–1.00)
Calcium: 8.3 mg/dL — ABNORMAL LOW (ref 8.9–10.3)
Chloride: 104 mmol/L (ref 101–111)
GFR calc Af Amer: >60 mL/min (ref >60)
GFR calc non Af Amer: >60 mL/min (ref >60)
GLUCOSE: 85 mg/dL (ref 65–99)
POTASSIUM: 3.2 mmol/L — AB (ref 3.5–5.1)
Sodium: 134 mmol/L — ABNORMAL LOW (ref 135–145)
Total Bilirubin: 0.5 mg/dL (ref 0.3–1.2)
Total Protein: 6.2 g/dL — ABNORMAL LOW (ref 6.5–8.1)

## 2015-05-15 LAB — URINE DRUG SCREEN, QUALITATIVE (ARMC ONLY)
Amphetamines, Ur Screen: NOT DETECTED
BARBITURATES, UR SCREEN: NOT DETECTED
Benzodiazepine, Ur Scrn: NOT DETECTED
Cannabinoid 50 Ng, Ur ~~LOC~~: NOT DETECTED
Cocaine Metabolite,Ur ~~LOC~~: POSITIVE — AB
MDMA (Ecstasy)Ur Screen: NOT DETECTED
Methadone Scn, Ur: NOT DETECTED
Opiate, Ur Screen: NOT DETECTED
PHENCYCLIDINE (PCP) UR S: NOT DETECTED
TRICYCLIC, UR SCREEN: NOT DETECTED

## 2015-05-15 LAB — CBC
HEMATOCRIT: 33.8 % — AB (ref 35.0–47.0)
Hemoglobin: 11.5 g/dL — ABNORMAL LOW (ref 12.0–16.0)
MCH: 29.8 pg (ref 26.0–34.0)
MCHC: 33.9 g/dL (ref 32.0–36.0)
MCV: 88 fL (ref 80.0–100.0)
PLATELETS: 236 10*3/uL (ref 150–440)
RBC: 3.84 MIL/uL (ref 3.80–5.20)
RDW: 14.1 % (ref 11.5–14.5)
WBC: 11.1 10*3/uL — ABNORMAL HIGH (ref 3.6–11.0)

## 2015-05-15 LAB — URINALYSIS COMPLETE WITH MICROSCOPIC (ARMC ONLY)
BACTERIA UA: NONE SEEN
Bilirubin Urine: NEGATIVE
Glucose, UA: NEGATIVE mg/dL
Hgb urine dipstick: NEGATIVE
Ketones, ur: NEGATIVE mg/dL
LEUKOCYTES UA: NEGATIVE
Nitrite: NEGATIVE
Protein, ur: NEGATIVE mg/dL
RBC / HPF: NONE SEEN RBC/hpf (ref 0–5)
Specific Gravity, Urine: 1.004 — ABNORMAL LOW (ref 1.005–1.030)
pH: 7 (ref 5.0–8.0)

## 2015-05-15 LAB — LIPASE, BLOOD: LIPASE: 13 U/L — AB (ref 22–51)

## 2015-05-15 LAB — HCG, QUANTITATIVE, PREGNANCY: hCG, Beta Chain, Quant, S: 12626 m[IU]/mL — ABNORMAL HIGH (ref <5)

## 2015-05-15 NOTE — Discharge Instructions (Signed)

## 2015-05-15 NOTE — ED Notes (Signed)
Report to [facility] and D. Pt to go there for eval and then to be discharged from there. Pt provided discharge instructions from ER prior to tx to L and D.

## 2015-05-15 NOTE — ED Provider Notes (Signed)
[facility] Emergency Department Provider Note     Time seen: ----------------------------------------- 2:05 PM on 05/15/2015 -----------------------------------------    I have reviewed the triage vital signs and the nursing notes.   HISTORY  Chief Complaint Abdominal Pain    HPI Kari Bailey is a 29 y.o. female who presents ER with sharp abdominal pain that comes and goes in the lower abdomen. Patient states his days ago she lifted her daughter up to her hip and she felt lower abdominal pain. States she just found out she is pregnant, is not sure how far along she is. She denies any fevers chills, chest pain shortness of breath. Also denies nausea vomiting or diarrhea.   Past Medical History  Diagnosis Date  . No pertinent past medical history     There are no active problems to display for this patient.   Past Surgical History  Procedure Laterality Date  . No past surgeries      Allergies Penicillins  Social History History  Substance Use Topics  . Smoking status: Current Every Day Smoker -- 0.50 packs/day    Types: Cigarettes  . Smokeless tobacco: Never Used  . Alcohol Use: No     Comment: irregular use.  occasional binge etoh    Review of Systems Constitutional: Negative for fever. Eyes: Negative for visual changes. ENT: Negative for sore throat. Cardiovascular: Negative for chest pain. Respiratory: Negative for shortness of breath. Gastrointestinal: Positive for abdominal pain, negative for vomiting or diarrhea Genitourinary: Negative for dysuria. Musculoskeletal: Negative for back pain. Skin: Negative for rash. Neurological: Negative for headaches, focal weakness or numbness.  10-point ROS otherwise negative.  ____________________________________________   PHYSICAL EXAM:  VITAL SIGNS: ED Triage Vitals  Enc Vitals Group     BP 05/15/15 1255 111/68 mmHg     Pulse Rate 05/15/15 1255 87     Resp 05/15/15 1255 16      Temp 05/15/15 1255 98 F (36.7 C)     Temp src --      SpO2 05/15/15 1255 98 %     Weight 05/15/15 1255 107 lb (48.535 kg)     Height 05/15/15 1255  (1.626 m)     Head Cir --      Peak Flow --      Pain Score --      Pain Loc --      Pain Edu? --      Excl. in GC? --     Constitutional: Alert and oriented. Well appearing and in no distress. Eyes: Conjunctivae are normal. PERRL. Normal extraocular movements. ENT   Head: Normocephalic and atraumatic.   Nose: No congestion/rhinnorhea.   Mouth/Throat: Mucous membranes are moist.   Neck: No stridor. Cardiovascular: Normal rate, regular rhythm. Normal and symmetric distal pulses are present in all extremities. No murmurs, rubs, or gallops. Respiratory: Normal respiratory effort without tachypnea nor retractions. Breath sounds are clear and equal bilaterally. No wheezes/rales/rhonchi. Gastrointestinal: Gravid uterus, approximately 19-20 weeks based on exam Musculoskeletal: Nontender with normal range of motion in all extremities. No joint effusions.  No lower extremity tenderness nor edema. Neurologic:  Normal speech and language. No gross focal neurologic deficits are appreciated. Speech is normal. No gait instability. Skin:  Skin is warm, dry and intact. No rash noted. Psychiatric: Mood and affect are normal. Speech and behavior are normal. Patient exhibits appropriate insight and judgment. ____________________________________________  ED COURSE:  Pertinent labs & imaging results that were available during my care of  the patient were reviewed by me and considered in my medical decision making (see chart for details). We'll check pregnancy labs, perform ultrasound reevaluate. ____________________________________________    LABS (pertinent positives/negatives)  Labs Reviewed  LIPASE, BLOOD - Abnormal; Notable for the following:    Lipase 13 (*)    All other components within normal limits  COMPREHENSIVE  METABOLIC PANEL - Abnormal; Notable for the following:    Sodium 134 (*)    Potassium 3.2 (*)    BUN 5 (*)    Creatinine, Ser 0.42 (*)    Calcium 8.3 (*)    Total Protein 6.2 (*)    Albumin 3.1 (*)    ALT 8 (*)    All other components within normal limits  CBC - Abnormal; Notable for the following:    WBC 11.1 (*)    Hemoglobin 11.5 (*)    HCT 33.8 (*)    All other components within normal limits  HCG, QUANTITATIVE, PREGNANCY - Abnormal; Notable for the following:    hCG, Beta Chain, Quant, S 12626 (*)    All other components within normal limits  URINALYSIS COMPLETEWITH MICROSCOPIC ([facility] ONLY) - Abnormal; Notable for the following:    Color, Urine STRAW (*)    APPearance CLEAR (*)    Specific Gravity, Urine 1.004 (*)    Squamous Epithelial / LPF 0-5 (*)    All other components within normal limits    RADIOLOGY Ultrasound OB limited IMPRESSION: Single live intrauterine gestation. The fetal heart rate is 135 beats per minute. The estimated gestational age is 22 weeks 0 days.  This exam is performed on an emergent basis and does not comprehensively evaluate fetal size, dating, or anatomy; follow-up complete OB US should be considered if further fetal assessment is warranted. ____________________________________________  FINAL ASSESSMENT AND PLAN  Second trimester pregnancy, abdominal pain  Plan: Patient with labs and imaging as dictated above. Patient has been having sharp lower abdominal pain sheet that she describes as tearing from time to time. She is past 20 weeks and needs to be observed for contractions in labor and delivery.   Emily Filbert, MD   Emily Filbert, MD 05/15/15 [phone]

## 2015-05-15 NOTE — Progress Notes (Signed)
Reviewed discharge instructions with patient, including reasons to return and dangers of cocaine use during pregnancy.

## 2015-05-15 NOTE — ED Notes (Signed)
Pt comes into the ED c/o sharp abd pain that comes and goes.  The pain started two days ago after she lifted her daughter onto her hip.  The patient states "I just found out I am pregnant and I have no clue how far along I am". Patient denies the sharp pains currently, N/V, diarrhea.

## 2015-05-15 NOTE — OB Triage Provider Note (Signed)
OB History & Physical   History of Present Illness:  Chief Complaint: abdominal pain  HPI:  Kari Bailey is a 29 y.o. G69P2002 female at [redacted]w[redacted]d dated by an ultrasound today ([facility] only).  Her pregnancy has been complicated by no prenatal care, cocaine use, alcohol use, marijuana use.    She denies contractions.   She denies leakage of fluid.   She denies vginal bleeding.   She reports fetal movement.  She presents with what she describes as a tightening of her abdomen. This is the description she gave me.  However, she presented to the ER with abdominal pain where she had a workup, including labs (negative hepato-biliary workup, pancrease). She also had an ultrasound diagnosing her with pregnancy.  Otherwise, her lab were unremarkable apart from her positive urine drug screen.  Maternal Medical History:   Past Medical History  Diagnosis Date  . Mental disorder     anxiety    Past Surgical History  Procedure Laterality Date  . No past surgeries      Allergies  Allergen Reactions  . Penicillins Rash    Prior to Admission medications   Medication Sig Start Date End Date Taking? Authorizing Provider  ibuprofen (ADVIL,MOTRIN) 400 MG tablet Take 400 mg by mouth every 6 (six) hours as needed (almost every day).   Yes Historical Provider, MD    OB History  Gravida Para Term Preterm AB SAB TAB Ectopic Multiple Living  3 2 2  0 0 0 0 0 0 2    # Outcome Date GA Lbr Len/2nd Weight Sex Delivery Anes PTL Lv  3 Current           2 Term 01/15/15    F Vag-Spont   Y  1 Term 07/22/05    Kari Bailey   Y      Prenatal care site: None  Social History: She  reports that she has been smoking Cigarettes.  She has been smoking about 0.50 packs per day. She has never used smokeless tobacco. She reports that she drinks alcohol. She reports that she uses illicit drugs (Cocaine and Marijuana).  Family History: family history is not on file.   Review of Systems: Negative x 10 systems reviewed  except as noted in the HPI.    Physical Exam:  Vital Signs: BP 113/58 mmHg  Pulse 90  Temp(Src) 98.6 F (37 C) (Oral)  Resp 18  Ht 5\' 4"  (1.626 m)  Wt 107 lb (48.535 kg)  BMI 18.36 kg/m2  SpO2 98%  LMP  (LMP Unknown) General: no acute distress.  HEENT: normocephalic, atraumatic Heart: regular rate & rhythm.  No murmurs/rubs/gallops Lungs: clear to auscultation bilaterally Abdomen: soft, gravid, non-tender;   Pelvic: Patient declines Extremities: non-tender, symmetric, no edema bilaterally.  DTRs: 2+  Neurologic: Alert & oriented x 3.    Pertinent Results:  Prenatal Labs: Unknown  Baseline FHR: positive in the normal range Contractions: absent Overall assessment: reassuring given gestational age  Assessment:  Kari Bailey is a 29 y.o. G62P2002 female at [redacted]w[redacted]d with abdominal pain. She is a poor historian and after a workup in the ER and noting a normal fetal heart rate she desires discharge.    Plan:  1. Abdominal pain: no etiology found. Patient described to me that the pain is more of a "tightening of her abdominal muscles."  Reassured patient and encouraged her to return to L&D should she develop contractions, pelvic pressure, vaginal bleeding, leakage of fluid, etc. 2. FWB: reassuring  overall 3. Recommended strongly to her that she get into prenatal care. 4. Drug abuse: discussed risks of using cocaine during pregnancy.  Discussed use of alcohol and cigarettes during pregnancy. Recommend she discontinue using ibuprofen during pregnancy in favor of tylenol.   Conard Novak, MD 05/15/2015 7:42 PM

## 2015-08-07 ENCOUNTER — Other Ambulatory Visit: Payer: Self-pay | Admitting: Advanced Practice Midwife

## 2015-08-07 DIAGNOSIS — Z3689 Encounter for other specified antenatal screening: Secondary | ICD-10-CM

## 2015-08-07 LAB — OB RESULTS CONSOLE GC/CHLAMYDIA
Chlamydia: NEGATIVE
GC PROBE AMP, GENITAL: NEGATIVE

## 2015-08-07 LAB — OB RESULTS CONSOLE HEPATITIS B SURFACE ANTIGEN: HEP B S AG: NEGATIVE

## 2015-08-07 LAB — OB RESULTS CONSOLE RPR: RPR: NONREACTIVE

## 2015-08-07 LAB — OB RESULTS CONSOLE ANTIBODY SCREEN: Antibody Screen: NEGATIVE

## 2015-08-07 LAB — OB RESULTS CONSOLE ABO/RH: RH TYPE: NEGATIVE

## 2015-08-16 ENCOUNTER — Ambulatory Visit
Admission: RE | Admit: 2015-08-16 | Discharge: 2015-08-16 | Disposition: A | Discharge status: Home / Self Care | Payer: Medicaid Other | Source: Ambulatory Visit | Attending: Advanced Practice Midwife | Admitting: Advanced Practice Midwife

## 2015-08-16 ENCOUNTER — Inpatient Hospital Stay
Admission: RE | Admit: 2015-08-16 | Discharge: 2015-08-16 | Disposition: A | Discharge status: Home / Self Care | Payer: Medicaid Other | Attending: Obstetrics & Gynecology | Admitting: Obstetrics & Gynecology

## 2015-08-16 ENCOUNTER — Encounter: Payer: Self-pay | Admitting: Certified Nurse Midwife

## 2015-08-16 DIAGNOSIS — Z36 Encounter for antenatal screening of mother: Secondary | ICD-10-CM | POA: Diagnosis not present

## 2015-08-16 DIAGNOSIS — F17200 Nicotine dependence, unspecified, uncomplicated: Secondary | ICD-10-CM | POA: Diagnosis present

## 2015-08-16 DIAGNOSIS — O99323 Drug use complicating pregnancy, third trimester: Secondary | ICD-10-CM | POA: Diagnosis present

## 2015-08-16 DIAGNOSIS — F329 Major depressive disorder, single episode, unspecified: Secondary | ICD-10-CM | POA: Diagnosis present

## 2015-08-16 DIAGNOSIS — Z3A35 35 weeks gestation of pregnancy: Secondary | ICD-10-CM | POA: Insufficient documentation

## 2015-08-16 DIAGNOSIS — F32A Depression, unspecified: Secondary | ICD-10-CM | POA: Diagnosis present

## 2015-08-16 DIAGNOSIS — Z3689 Encounter for other specified antenatal screening: Secondary | ICD-10-CM

## 2015-08-16 HISTORY — DX: Drug use complicating pregnancy, third trimester: O99.323

## 2015-08-16 NOTE — Progress Notes (Signed)
L&D Triage Note  29 year old G3P2002 WF who presents for a NST at 35.[redacted] weeks gestation (based on a 22 week BPD) NST s are being done due to her substance abuse in pregnancy (cocaine (usu uses weekly), MJ (occasionally), tobacco (1/2 PPD)). Prenatal care at [facility] begun recently   FHR: baseline 125-130 with accelerations to 140s with moderate variability Toco: rare contraction    A: IUP at 35.2 weeks with reactive NST  P: Weekly NST Has appt with DP today for ultrasound/ consult FU at [facility] next week  Ramin Zoll, CNM

## 2015-08-16 NOTE — Discharge Summary (Signed)
Patient discharged home ( patient has US at 1300), discharge instructions given, patient states understanding. Patient left floor in stable condition, denies any other needs at this time. Patient to keep next scheduled OB appointment Nst scheduled for next Thursday

## 2015-08-16 NOTE — Discharge Instructions (Signed)
Drink plenty of fluid and get plenty of rest. Call your provider for any other concerns °

## 2015-08-16 NOTE — OB Triage Note (Signed)
Scheduled NST for drug use.

## 2015-08-16 NOTE — Progress Notes (Signed)
Patient arrived for schedule NST for smoking and drug use- cocaine. Patient asked to collect urine and missed cup however admits to cocaine use two days ago with an average weekly use and MJ use last week with occasional use. Denies any other drug use and denies alcohol use. Patient very vague and poor historian. Patient has very Flat affect denies any thought of harming herself but state she has ongoing depression but does not take anything at this time. Patient refuses any help or assiatance for the drug use or smoking. When asked if she has been educated on the effects of drug use for her and baby she states yes but she "doesn't care" and that "its a hard time right now" and " it wasn't the right time to be pregnant"

## 2015-08-23 LAB — OB RESULTS CONSOLE GBS: STREP GROUP B AG: NEGATIVE

## 2015-08-25 ENCOUNTER — Encounter: Payer: Self-pay | Admitting: *Deleted

## 2015-08-25 ENCOUNTER — Inpatient Hospital Stay
Admission: EM | Admit: 2015-08-25 | Discharge: 2015-08-27 | DRG: 775 | Disposition: A | Discharge status: Home / Self Care | Payer: Medicaid Other | Attending: Obstetrics and Gynecology | Admitting: Obstetrics and Gynecology

## 2015-08-25 DIAGNOSIS — O99323 Drug use complicating pregnancy, third trimester: Secondary | ICD-10-CM | POA: Diagnosis present

## 2015-08-25 DIAGNOSIS — F53 Puerperal psychosis: Secondary | ICD-10-CM | POA: Diagnosis present

## 2015-08-25 DIAGNOSIS — F17210 Nicotine dependence, cigarettes, uncomplicated: Secondary | ICD-10-CM | POA: Diagnosis present

## 2015-08-25 DIAGNOSIS — F1410 Cocaine abuse, uncomplicated: Secondary | ICD-10-CM | POA: Diagnosis present

## 2015-08-25 DIAGNOSIS — Z3A36 36 weeks gestation of pregnancy: Secondary | ICD-10-CM

## 2015-08-25 DIAGNOSIS — O99325 Drug use complicating the puerperium: Secondary | ICD-10-CM | POA: Diagnosis present

## 2015-08-25 DIAGNOSIS — O4390 Unspecified placental disorder, unspecified trimester: Secondary | ICD-10-CM

## 2015-08-25 DIAGNOSIS — F1210 Cannabis abuse, uncomplicated: Secondary | ICD-10-CM | POA: Diagnosis present

## 2015-08-25 DIAGNOSIS — F329 Major depressive disorder, single episode, unspecified: Secondary | ICD-10-CM | POA: Diagnosis present

## 2015-08-25 DIAGNOSIS — O99335 Smoking (tobacco) complicating the puerperium: Secondary | ICD-10-CM | POA: Diagnosis present

## 2015-08-25 DIAGNOSIS — F32A Depression, unspecified: Secondary | ICD-10-CM | POA: Diagnosis present

## 2015-08-25 HISTORY — DX: Anxiety disorder, unspecified: F41.9

## 2015-08-25 HISTORY — DX: Major depressive disorder, single episode, unspecified: F32.9

## 2015-08-25 HISTORY — DX: Depression, unspecified: F32.A

## 2015-08-25 LAB — URINE DRUG SCREEN, QUALITATIVE (ARMC ONLY)
AMPHETAMINES, UR SCREEN: NOT DETECTED
Barbiturates, Ur Screen: NOT DETECTED
Benzodiazepine, Ur Scrn: NOT DETECTED
CANNABINOID 50 NG, UR ~~LOC~~: POSITIVE — AB
COCAINE METABOLITE, UR ~~LOC~~: POSITIVE — AB
MDMA (ECSTASY) UR SCREEN: NOT DETECTED
Methadone Scn, Ur: NOT DETECTED
Opiate, Ur Screen: NOT DETECTED
Phencyclidine (PCP) Ur S: NOT DETECTED
TRICYCLIC, UR SCREEN: NOT DETECTED

## 2015-08-25 MED ORDER — DIPHENHYDRAMINE HCL 25 MG PO CAPS: 25.0000 mg | ORAL_CAPSULE | Freq: Four times a day (QID) | ORAL | Status: DC | PRN

## 2015-08-25 MED ORDER — SENNOSIDES-DOCUSATE SODIUM 8.6-50 MG PO TABS
2.0000 | ORAL_TABLET | ORAL | Status: DC
  Administered 2015-08-26 – 2015-08-27 (×2): 2 via ORAL
  Filled 2015-08-25 (×2): qty 2

## 2015-08-25 MED ORDER — BENZOCAINE-MENTHOL 20-0.5 % EX AERO
1.0000 | INHALATION_SPRAY | CUTANEOUS | Status: DC | PRN
Start: 2015-08-25 — End: 2015-08-27

## 2015-08-25 MED ORDER — IBUPROFEN 600 MG PO TABS
600.0000 mg | ORAL_TABLET | Freq: Four times a day (QID) | ORAL | Status: DC
  Administered 2015-08-25 – 2015-08-27 (×8): 600 mg via ORAL
  Filled 2015-08-25 (×9): qty 1

## 2015-08-25 MED ORDER — ACETAMINOPHEN 325 MG PO TABS: 650.0000 mg | ORAL_TABLET | ORAL | Status: DC | PRN

## 2015-08-25 MED ORDER — OXYCODONE-ACETAMINOPHEN 5-325 MG PO TABS
1.0000 | ORAL_TABLET | ORAL | Status: DC | PRN
Start: 2015-08-25 — End: 2015-08-27
  Administered 2015-08-25 – 2015-08-27 (×5): 1 via ORAL
  Filled 2015-08-25 (×5): qty 1

## 2015-08-25 MED ORDER — FERROUS SULFATE 325 (65 FE) MG PO TABS
325.0000 mg | ORAL_TABLET | Freq: Two times a day (BID) | ORAL | Status: DC
  Administered 2015-08-26 – 2015-08-27 (×3): 325 mg via ORAL
  Filled 2015-08-25 (×3): qty 1

## 2015-08-25 MED ORDER — WITCH HAZEL-GLYCERIN EX PADS
1.0000 | MEDICATED_PAD | CUTANEOUS | Status: DC | PRN
Start: 2015-08-25 — End: 2015-08-27

## 2015-08-25 MED ORDER — OXYCODONE-ACETAMINOPHEN 5-325 MG PO TABS
2.0000 | ORAL_TABLET | ORAL | Status: DC | PRN
  Administered 2015-08-27: 2 via ORAL
  Filled 2015-08-25 (×2): qty 2

## 2015-08-25 MED ORDER — PRENATAL MULTIVITAMIN CH
1.0000 | ORAL_TABLET | Freq: Every day | ORAL | Status: DC
  Administered 2015-08-26 – 2015-08-27 (×2): 1 via ORAL
  Filled 2015-08-25 (×2): qty 1

## 2015-08-25 MED ORDER — OXYTOCIN 40 UNITS IN LACTATED RINGERS INFUSION - SIMPLE MED: 62.5000 mL/h | INTRAVENOUS | Status: DC | PRN

## 2015-08-25 MED ORDER — ONDANSETRON HCL 4 MG PO TABS: 4.0000 mg | ORAL_TABLET | ORAL | Status: DC | PRN

## 2015-08-25 MED ORDER — SIMETHICONE 80 MG PO CHEW
80.0000 mg | CHEWABLE_TABLET | ORAL | Status: DC | PRN
Start: 2015-08-25 — End: 2015-08-27

## 2015-08-25 MED ORDER — LANOLIN HYDROUS EX OINT: TOPICAL_OINTMENT | CUTANEOUS | Status: DC | PRN

## 2015-08-25 MED ORDER — ONDANSETRON HCL 4 MG/2ML IJ SOLN: 4.0000 mg | INTRAMUSCULAR | Status: DC | PRN

## 2015-08-25 MED ORDER — SERTRALINE HCL 25 MG PO TABS
25.0000 mg | ORAL_TABLET | Freq: Every day | ORAL | Status: DC
  Administered 2015-08-25 – 2015-08-26 (×2): 25 mg via ORAL
  Filled 2015-08-25 (×3): qty 1

## 2015-08-25 MED ORDER — DIBUCAINE 1 % RE OINT: 1.0000 "application " | TOPICAL_OINTMENT | RECTAL | Status: DC | PRN

## 2015-08-25 NOTE — ED Provider Notes (Signed)
[facility] Emergency Department Provider Note  ____________________________________________  Time seen: Approximately 5:00 PM  I have reviewed the triage vital signs and the nursing notes.   HISTORY  Chief Complaint No chief complaint on file.  Delivered baby in car  HPI Kari Bailey is a 29 y.o. female who is having her third pregnancy. She was doing fine at 36 weeks and began having cramps at home. Called her father. Father began driving her here but before they got here and the baby was delivered in her pants. Mom pulled the baby out and was holding him in her arms wrapped up in a towel. We got mom and out of the car on a stretcher and brought him into the emergency room. There was cleaned and dried and warmeD with warm towels. The umbilical cord was cut. Mom was undressed he was put on her chest. At the time we saw the baby which was probably 10 minutes later the baby initially was somewhat blue but rapidly became pink after being dried crying spontaneously had good tone was moving all extremities well. Mom was having a lot of cramps therefore I grasped the umbilical cord and then gently delivered the placenta. This relieved mom's cramps mom had no further bleeding. Neonatology came down to take care of the baby and remove the mom and baby upstairs to labor and delivery. Patient reports she was being treated at the health department  Past Medical History  Diagnosis Date  . Mental disorder     anxiety  . Substance abuse affecting pregnancy in third trimester, antepartum     Patient Active Problem List   Diagnosis Date Noted  . Substance abuse affecting pregnancy in third trimester, antepartum 08/16/2015  . Depression 08/16/2015  . Tobacco use disorder 08/16/2015  . Abdominal pain 05/15/2015    Past Surgical History  Procedure Laterality Date  . No past surgeries      No current outpatient prescriptions on file.  Allergies Penicillins  No  family history on file.  Social History Social History  Substance Use Topics  . Smoking status: Current Every Day Smoker -- 0.50 packs/day    Types: Cigarettes  . Smokeless tobacco: Never Used  . Alcohol Use: No     Comment: irregular use.  occasional binge etoh    Review of Systems Review of systems not obtained due to the acuity of the patient. Patient did however said say that everything was working well and she had no other complaints.  ____________________________________________   PHYSICAL EXAM:  VITAL SIGNS: ED Triage Vitals  Enc Vitals Group     BP --      Pulse --      Resp --      Temp --      Temp src --      SpO2 --      Weight --      Height --      Head Cir --      Peak Flow --      Pain Score --      Pain Loc --      Pain Edu? --      Excl. in GC? --     Constitutional: Alert and oriented.  Eyes: Conjunctivae are normal. PERRL. EOMI. Head: Atraumatic. Nose: No congestion/rhinnorhea. Mouth/Throat: Mucous membranes are moist.  Oropharynx non-erythematous. Neck: No stridor.   Cardiovascular: Normal rate, regular rhythm. Grossly normal heart sounds.  Good peripheral circulation. Respiratory: Normal respiratory effort.  No retractions. Lungs CTAB. Gastrointestinal: Soft and nontender. No distention. No abdominal bruits. Uterine fundus was just above the pelvic brim. Musculoskeletal: No lower extremity tenderness nor edema.  No joint effusions. Neurologic:  Normal speech and language. No gross focal neurologic deficits are appreciated. No gait instability. Skin:  Skin is warm, dry and intact. No rash noted. GU: After placenta was delivered there were no further bleeding no obvious lacerations on the perineum did not do an internal exam was going to go upstairs within minutes.  ____________________________________________   LABS (all labs ordered are listed, but only abnormal results are displayed)  Labs Reviewed  URINE DRUG SCREEN, QUALITATIVE ([facility]  ONLY)  SURGICAL PATHOLOGY   ____________________________________________  EKG   ____________________________________________  RADIOLOGY   ____________________________________________   PROCEDURES    ____________________________________________   INITIAL IMPRESSION / ASSESSMENT AND PLAN / ED COURSE  Pertinent labs & imaging results that were available during my care of the patient were reviewed by me and considered in my medical decision making (see chart for details).  ____________________________________________   FINAL CLINICAL IMPRESSION(S) / ED DIAGNOSES  Final diagnoses:  Placental condition affecting management of mother, delivered      Arnaldo Natal, MD 08/25/15 [phone]

## 2015-08-25 NOTE — Progress Notes (Signed)
   08/25/15 1600  Clinical Encounter Type  Visited With Patient;Health care provider  Visit Type Code  Referral From Nurse  Consult/Referral To Chaplain  [facility]  When I arrived in unit patient had delivered a baby boy. Provided pastoral presence and support to patient, baby and nursing staff who were present in room.  [employer] Automotive GroupChaplain Rhonda Linan-pager [phone]

## 2015-08-25 NOTE — H&P (Signed)
OB History & Physical   History of Present Illness:  Chief Complaint: delivered in car on the way to the hospital  HPI:  Kari Bailey is a 29 y.o. 683P2002 female at 1833w2d.  Her pregnancy has been substance abuse and substantial psychiatric history.    The patient arrived at labor and delivery after already having delivered in the car and the placenta had already delivered, as well.  She has no current complaints.   Maternal Medical History:   Past Medical History  Diagnosis Date  . Mental disorder     anxiety  . Substance abuse affecting pregnancy in third trimester, antepartum   . Anxiety   . Depression   . Preterm labor     Past Surgical History  Procedure Laterality Date  . No past surgeries      Allergies  Allergen Reactions  . Penicillins Rash    Prior to Admission medications   Medication Sig Start Date End Date Taking? Authorizing Provider  Prenatal Vit-Fe Fumarate-FA (MULTIVITAMIN-PRENATAL) 27-0.8 MG TABS tablet Take 1 tablet by mouth daily at 12 noon.   Yes Historical Provider, MD  sertraline (ZOLOFT) 25 MG tablet Take 25 mg by mouth at bedtime.   Yes Historical Provider, MD    OB History  Gravida Para Term Preterm AB SAB TAB Ectopic Multiple Living  3 2 2  0 0 0 0 0 0 2    # Outcome Date GA Lbr Len/2nd Weight Sex Delivery Anes PTL Lv  3 Current           2 Term 01/15/15    F Vag-Spont   Y  1 Term 07/22/05    Genella MechM Vag-Spont   Y      Prenatal care site: [facility]  Social History: She  reports that she has been smoking Cigarettes.  She has a 7.5 pack-year smoking history. She has never used smokeless tobacco. She reports that she drinks alcohol. She reports that she uses illicit drugs (Cocaine and Marijuana) about once per week.  Family History: family history is not on file.   Review of Systems: Negative x 10 systems reviewed except as noted in the HPI.    Physical Exam:  Vital Signs: BP 130/81 mmHg  Pulse 66  Temp(Src) 98.4 F (36.9 C) (Oral)  Resp  18  SpO2 99%  LMP  (LMP Unknown) General: no acute distress. Appears a bit sluggish. HEENT: normocephalic, atraumatic Heart: regular rate & rhythm.  No murmurs/rubs/gallops Lungs: clear to auscultation bilaterally Abdomen: soft, gravid, uterus at umb - 3cm Pelvic: small right labia majora abrasion, hemostatic. The rest of the vulva and vagina are intact with expected lochia. Extremities: non-tender, symmetric, no edema bilaterally.   Neurologic: Alert & oriented x 3.    Assessment:  Kari Bailey is a 29 y.o. [phone] female at 5633w2d who is status post SVD and spontaneous passage of the placenta with a significant substance abuse history.   Plan:  1. Admit to Labor & Delivery  2. UDS as soon as possible 3. Routine postpartum care 4. Get social work involved tomorrow   Conard NovakJackson, Sascha Palma D, MD 08/25/2015 7:02 PM

## 2015-08-25 NOTE — Progress Notes (Signed)
To SCN To See Infant With Pt.'s Father.

## 2015-08-26 LAB — CBC
HCT: 37.8 % (ref 35.0–47.0)
Hemoglobin: 12.5 g/dL (ref 12.0–16.0)
MCH: 27.4 pg (ref 26.0–34.0)
MCHC: 33.2 g/dL (ref 32.0–36.0)
MCV: 82.5 fL (ref 80.0–100.0)
PLATELETS: 260 10*3/uL (ref 150–440)
RBC: 4.58 MIL/uL (ref 3.80–5.20)
RDW: 13.6 % (ref 11.5–14.5)
WBC: 11.6 10*3/uL — AB (ref 3.6–11.0)

## 2015-08-26 NOTE — Clinical Social Work Maternal (Signed)
  CLINICAL SOCIAL WORK MATERNAL/CHILD NOTE  Patient Details  Name: Kari Bailey MRN: [id number] Date of Birth: [birth year]  Date:  08/26/2015  Clinical Social Worker Initiating Note:    Date/ Time Initiated:  08/26/15/1703     Child's Name:    Boy  Legal Guardian:  Mother   Need for Interpreter:  None   Date of Referral:  08/25/15     Reason for Referral:  Current Substance Use/Substance Use During Pregnancy    Referral Source:  RN   Address:   (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 CSW met with RN prior to assessment. Per RN mom is positive for cocaine and marijuana. Per RN patient's baby's urine drug screen has not come back yet. CSW made RN aware that a CPS report cannot be made until baby's drug screen is back. CSW met with patient to address consult. Patient's younger niece was at bedside. CSW asked niece to step out so CSW and patient could speak in private. Niece politely stepped out of the room. CSW introduced self and explained role of CSW department. Patient reported that she lives at [facility], [place] [address]) with her father Marguerite Olea. 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 Per patient she has a 29 y.o son that lives with his grandmother and an 29 month old that also lives with her grandmother. Patient reported that she has [facility] and adequate baby supplies. Mom reported that the only thing she may need is the small bottles for premature babies. Patient reported that she does have a reliable vehicle for transportation.  CSW asked patient about the cocaine and marijuana use. Patient reported that she used marijuana on a weekly basis as an appetite stimulate. Mom reported that she was very sick and in a lot of pain and tylenol was not helping. Mom reported that she used cocaine because she works "crazy hours." Patient reported that she has been using cocaine and marijuana on and off for several years. Patient reported that she was [facility] and stopped going. CSW offered patient substance abuse resources including [facility]. Patient is familiar with [facility] and declined substance abuse resources. CSW explained to mom that if baby's drug screen comes back positive a CPS report will have to be made. Patient verbalized her understanding.   CSW Plan/Description:  Child Protective Service Report  (Probably CPS report pending drug screen for baby. )    Elwyn Reach 08/26/2015, 5:07 PM

## 2015-08-26 NOTE — Progress Notes (Signed)
Patient ID: Kari Bailey, female   DOB: 30-Dec-1985, 29 y.o.   MRN: [id number] Obstetric Postpartum Daily Progress Note Subjective:  29 y.o. [id number] postpartum day #1 status post vaginal delivery in care on the way to the hospital.  She is ambulating, is tolerating po, is voiding spontaneously.  Her pain is well controlled on PO pain medications. Her lochia is less than menses.   Medications SCHEDULED MEDICATIONS  . ferrous sulfate  325 mg Oral BID WC  . ibuprofen  600 mg Oral 4 times per day  . prenatal multivitamin  1 tablet Oral Q1200  . senna-docusate  2 tablet Oral Q24H  . sertraline  25 mg Oral QHS    MEDICATION INFUSIONS  . oxytocin 40 units in LR 1000 mL      PRN MEDICATIONS  acetaminophen, benzocaine-Menthol, witch hazel-glycerin **AND** dibucaine, diphenhydrAMINE, lanolin, ondansetron **OR** ondansetron (ZOFRAN) IV, oxyCODONE-acetaminophen, oxyCODONE-acetaminophen, oxytocin 40 units in LR 1000 mL, simethicone    Objective:   Filed Vitals:   08/25/15 1943 08/25/15 2350 08/26/15 0322 08/26/15 0740  BP: 129/78 117/68 118/80 126/79  Pulse: 62 63 54 87  Temp: 98.3 F (36.8 C) 97.7 F (36.5 C) 98 F (36.7 C) 98.1 F (36.7 C)  TempSrc: Oral Oral Oral Oral  Resp: 20 20 18 18   SpO2: 99% 99% 98% 97%    Current Vital Signs 24h Vital Sign Ranges  T 98.1 F (36.7 C) Temp  Avg: 98.1 F (36.7 C)  Min: 97.7 F (36.5 C)  Max: 98.4 F (36.9 C)  BP  (out of room) BP  Min: 117/68  Max: 147/84  HR 87 Pulse  Avg: 67.7  Min: 54  Max: 87  RR 18 Resp  Avg: 18.6  Min: 18  Max: 20  SaO2 97 % Not Delivered SpO2  Avg: 98.4 %  Min: 97 %  Max: 99 %       24 Hour I/O Current Shift I/O  Time Ins Outs 11/05 0701 - 11/06 0700 In: 600 [P.O.:600] Out: 1350 [Urine:1350]    General: NAD Pulmonary: no increased work of breathing Abdomen: non-distended, non-tender, fundus firm at level of umbilicus Extremities: no edema, no erythema, no tenderness  Labs:   Recent Labs Lab 08/26/15 0430   WBC 11.6*  HGB 12.5  HCT 37.8  PLT 260     Assessment:   29 y.o. [id number] postpartum day # 1 status post precipitous delivery en route to hospital.  Patient has significant psychological and social concerns.  Plan:   1) Polysubstance abuse and psych concerns: SW consult.   2) O negative / Rubella Immune / Varicella Immune  3) TDAP/Flu status received in antepartum period  4) formula feeding /Contraception = Depo-Provera  5) Disposition: likely home tomorrow.  Kari MohairStephen Shaunte Tuft, MD 08/26/2015 2:07 PM

## 2015-08-26 NOTE — Discharge Summary (Signed)
OB Discharge Summary  Patient Name: ALEZA PEW DOB: [birth year] MRN: [id number]  Date of admission: 08/25/2015 Delivering MD: Thomasene Mohair, MD Date of Delivery: 08/25/2015 - prior to arrival to hospital Date of discharge: 08/27/2015  Admitting diagnosis:  1) high risk pregnancy at 36 weeks 2) scant prenatal care 3) history of polysubstance abuse (multiple positive cocaine screens along with marijuana), also present on admission. 4) Significant psychiatric history. On Zoloft, started several days prior to admission.  Intrauterine pregnancy: [redacted]w[redacted]d      Secondary diagnosis: None     Discharge diagnosis: Preterm Pregnancy Delivered 1) high risk pregnancy at 36 weeks 2) scant prenatal care 3) history of polysubstance abuse (multiple positive cocaine screens along with marijuana), also present on admission. 4) Significant psychiatric history. On Zoloft, started several days prior to admission.                                                               Post partum procedures: None  Augmentation: not applicable  Complications: precipitous delivery en route to hospital.   Hospital course:  Admitted after having delivered both the baby and placenta prior to arrival to unit.  She was examined and found to have no significant lacerations.  In the postpartum period she did well. She is rh negative and so is the baby. She had a social work consult given her history of polysubstance abuse (+cocaine and THC screen on admission) and psychiatric issues.  Physical exam  Filed Vitals:   08/26/15 2012 08/26/15 2348 08/27/15 0423 08/27/15 0748  BP: 130/83 132/72 131/76 124/83  Pulse: 60 58 60 56  Temp: 97.7 F (36.5 C) 97.7 F (36.5 C) 97.5 F (36.4 C) 97.9 F (36.6 C)  TempSrc: Oral Oral Oral Oral  Resp:  SpO2: 99% 94% 99% 97%   General: alert, cooperative and no distress Lochia: appropriate Uterine Fundus: firm Incision: N/A DVT Evaluation: No evidence of DVT seen  on physical exam.  Labs: Lab Results  Component Value Date   WBC 11.6* 08/26/2015   HGB 12.5 08/26/2015   HCT 37.8 08/26/2015   MCV 82.5 08/26/2015   PLT 260 08/26/2015   CMP Latest Ref Rng 05/15/2015  Glucose 65 - 99 mg/dL 85  BUN 6 - 20 mg/dL 5(L)  Creatinine 2.53 - 1.00 mg/dL 6.64(Q)  Sodium 034 - 742 mmol/L 134(L)  Potassium 3.5 - 5.1 mmol/L 3.2(L)  Chloride 101 - 111 mmol/L 104  CO2 22 - 32 mmol/L 22  Calcium 8.9 - 10.3 mg/dL 8.3(L)  Total Protein 6.5 - 8.1 g/dL 6.2(L)  Total Bilirubin 0.3 - 1.2 mg/dL 0.5  Alkaline Phos 38 - 126 U/L 60  AST 15 - 41 U/L 17  ALT 14 - 54 U/L 8(L)    Discharge instruction: per After Visit Summary.  Medications:    Medication List    STOP taking these medications        nitrofurantoin (macrocrystal-monohydrate) 100 MG capsule  Commonly known as:  MACROBID      TAKE these medications        ibuprofen 600 MG tablet  Commonly known as:  ADVIL,MOTRIN  Take 1 tablet (600 mg total) by mouth every 6 (six) hours as needed for moderate pain (almost every day).  multivitamin-prenatal 27-0.8 MG Tabs tablet  Take 1 tablet by mouth daily at 12 noon.     sertraline 25 MG tablet  Commonly known as:  ZOLOFT  Take 25 mg by mouth at bedtime.        Diet: routine diet  Activity: Advance as tolerated. Pelvic rest for 6 weeks.   Outpatient follow up: No Follow-up on file.  Postpartum contraception: Depo Provera Rhogam Given postpartum: no Rubella vaccine given postpartum: no Varicella vaccine given postpartum: no TDaP given antepartum or postpartum: yes Flu vaccine given antepartum or postpartum: yes  Newborn Data: Live born female  Birth Weight: 5 lb 0.4 oz (2280 g) APGAR: n/a  Baby Feeding: formula  Disposition:home with mother  Lorrene ReidSTAEBLER, Tahara Ruffini M, MD 08/27/2015 9:38 AM

## 2015-08-27 MED ORDER — IBUPROFEN 600 MG PO TABS: 600.0000 mg | ORAL_TABLET | Freq: Four times a day (QID) | ORAL | Status: AC | PRN

## 2015-08-27 MED ORDER — MEDROXYPROGESTERONE ACETATE 150 MG/ML IM SUSP
150.0000 mg | INTRAMUSCULAR | Status: DC
  Administered 2015-08-27: 150 mg via INTRAMUSCULAR
  Filled 2015-08-27: qty 1

## 2015-08-27 NOTE — Progress Notes (Signed)
CSW followed up with Pt regarding consult. CSW made CPS [place] with [facility] based on +UDS of infant. Pt aware of report and policy. CSW discussed with Pt motivation to parent the child and CSW concerns for attachment given recent comments, circumstances, and patient's current depressive mood. Pt reassured CSW that she is slowly attaching to baby and has made plans to parent child. She has not notified the FOB of the pregnancy or birth and is not looking forward to telling FOB. Pt's father has terminal liver cancer Stage IV and is [facility]. Pt will be home on FMLA for 4 weeks before returning back to work. CSW concerned about child care arrangements and patient's need to follow up for depression and substance use. CSW discussed [facility] and [facility] with Pt. Last tx was 10 years ago with [facility]. Pt has not been in tx since that time and considers recent use relapse.   Carseat was brought in by family but Pt needed new seat with options for straps to be lower in the seat due to baby's size. Car seat given to family by unit to use.   Wilford Gristara Loran Fleet, LCSW [phone]

## 2015-08-28 LAB — SURGICAL PATHOLOGY

## 2015-09-06 ENCOUNTER — Ambulatory Visit: Payer: Medicaid Other

## 2016-02-25 ENCOUNTER — Encounter: Payer: Self-pay | Admitting: Emergency Medicine

## 2016-02-25 ENCOUNTER — Emergency Department
Admission: EM | Admit: 2016-02-25 | Discharge: 2016-02-25 | Disposition: A | Discharge status: Home / Self Care | Payer: Medicaid Other | Attending: Emergency Medicine | Admitting: Emergency Medicine

## 2016-02-25 DIAGNOSIS — F17210 Nicotine dependence, cigarettes, uncomplicated: Secondary | ICD-10-CM | POA: Diagnosis not present

## 2016-02-25 DIAGNOSIS — R1030 Lower abdominal pain, unspecified: Secondary | ICD-10-CM

## 2016-02-25 DIAGNOSIS — F1410 Cocaine abuse, uncomplicated: Secondary | ICD-10-CM | POA: Insufficient documentation

## 2016-02-25 DIAGNOSIS — F329 Major depressive disorder, single episode, unspecified: Secondary | ICD-10-CM | POA: Diagnosis not present

## 2016-02-25 DIAGNOSIS — F1210 Cannabis abuse, uncomplicated: Secondary | ICD-10-CM | POA: Insufficient documentation

## 2016-02-25 DIAGNOSIS — Z79899 Other long term (current) drug therapy: Secondary | ICD-10-CM | POA: Diagnosis not present

## 2016-02-25 DIAGNOSIS — N390 Urinary tract infection, site not specified: Secondary | ICD-10-CM | POA: Insufficient documentation

## 2016-02-25 LAB — COMPREHENSIVE METABOLIC PANEL
ALBUMIN: 4.3 g/dL (ref 3.5–5.0)
ALK PHOS: 61 U/L (ref 38–126)
ALT: 10 U/L — AB (ref 14–54)
AST: 22 U/L (ref 15–41)
Anion gap: 10 (ref 5–15)
BILIRUBIN TOTAL: 0.8 mg/dL (ref 0.3–1.2)
BUN: 8 mg/dL (ref 6–20)
CALCIUM: 9.3 mg/dL (ref 8.9–10.3)
CO2: 22 mmol/L (ref 22–32)
Chloride: 104 mmol/L (ref 101–111)
Creatinine, Ser: 0.6 mg/dL (ref 0.44–1.00)
GFR calc Af Amer: >60 mL/min (ref >60)
GFR calc non Af Amer: >60 mL/min (ref >60)
GLUCOSE: 127 mg/dL — AB (ref 65–99)
Potassium: 3.6 mmol/L (ref 3.5–5.1)
Sodium: 136 mmol/L (ref 135–145)
TOTAL PROTEIN: 7.2 g/dL (ref 6.5–8.1)

## 2016-02-25 LAB — URINALYSIS COMPLETE WITH MICROSCOPIC (ARMC ONLY)
BILIRUBIN URINE: NEGATIVE
GLUCOSE, UA: NEGATIVE mg/dL
Ketones, ur: NEGATIVE mg/dL
Nitrite: NEGATIVE
PH: 5 (ref 5.0–8.0)
Protein, ur: 30 mg/dL — AB
Specific Gravity, Urine: 1.017 (ref 1.005–1.030)

## 2016-02-25 LAB — CBC WITH DIFFERENTIAL/PLATELET
Basophils Absolute: 0 K/uL (ref 0–0.1)
Basophils Relative: 0 %
Eosinophils Absolute: 0.1 K/uL (ref 0–0.7)
Eosinophils Relative: 1 %
HCT: 41.2 % (ref 35.0–47.0)
Hemoglobin: 13.9 g/dL (ref 12.0–16.0)
Lymphocytes Relative: 14 %
Lymphs Abs: 1.9 K/uL (ref 1.0–3.6)
MCH: 28.3 pg (ref 26.0–34.0)
MCHC: 33.8 g/dL (ref 32.0–36.0)
MCV: 83.8 fL (ref 80.0–100.0)
Monocytes Absolute: 0.7 K/uL (ref 0.2–0.9)
Monocytes Relative: 5 %
Neutro Abs: 10.8 K/uL — ABNORMAL HIGH (ref 1.4–6.5)
Neutrophils Relative %: 80 %
Platelets: 307 K/uL (ref 150–440)
RBC: 4.92 MIL/uL (ref 3.80–5.20)
RDW: 15 % — ABNORMAL HIGH (ref 11.5–14.5)
WBC: 13.5 K/uL — ABNORMAL HIGH (ref 3.6–11.0)

## 2016-02-25 LAB — LIPASE, BLOOD: Lipase: 16 U/L (ref 11–51)

## 2016-02-25 LAB — POCT PREGNANCY, URINE: PREG TEST UR: NEGATIVE

## 2016-02-25 MED ORDER — CEPHALEXIN 500 MG PO CAPS: 500.0000 mg | ORAL_CAPSULE | Freq: Three times a day (TID) | ORAL | Status: DC

## 2016-02-25 MED ORDER — PHENAZOPYRIDINE HCL 200 MG PO TABS
200.0000 mg | ORAL_TABLET | Freq: Once | ORAL | Status: AC
  Administered 2016-02-25: 200 mg via ORAL
  Filled 2016-02-25 (×2): qty 1

## 2016-02-25 MED ORDER — TRAMADOL HCL 50 MG PO TABS: 50.0000 mg | ORAL_TABLET | Freq: Four times a day (QID) | ORAL | Status: DC | PRN

## 2016-02-25 MED ORDER — PHENAZOPYRIDINE HCL 200 MG PO TABS: 200.0000 mg | ORAL_TABLET | Freq: Three times a day (TID) | ORAL | Status: AC | PRN

## 2016-02-25 MED ORDER — CEPHALEXIN 500 MG PO CAPS
500.0000 mg | ORAL_CAPSULE | Freq: Once | ORAL | Status: AC
  Administered 2016-02-25: 500 mg via ORAL
  Filled 2016-02-25: qty 1

## 2016-02-25 MED ORDER — TRAMADOL HCL 50 MG PO TABS
100.0000 mg | ORAL_TABLET | Freq: Once | ORAL | Status: AC
  Administered 2016-02-25: 100 mg via ORAL
  Filled 2016-02-25: qty 2

## 2016-02-25 NOTE — ED Notes (Signed)
Pt. States she was able to get a hold of brother for a ride home.

## 2016-02-25 NOTE — ED Notes (Signed)
Pt. States "I often wait too long before going to the bathroom".  Pt. States "I get UTI's often".  Pt. States "I drink a lot of Dr. Reino KentPepper".

## 2016-02-25 NOTE — ED Notes (Signed)
Reports abd pain since yesterday, worse after eating.

## 2016-02-25 NOTE — ED Notes (Signed)
Pt. States sharp cramping centralized abdominal pain that started this a.m.  Pt. States she drove up here by herself.

## 2016-02-25 NOTE — Discharge Instructions (Signed)
You have been seen in the emergency department today for abdominal pain. Your workup is consistent with a urinary tract infection. Please take your medications as prescribed. If your symptoms worsen, or fail to improve in the next 48 hours or you develop a fever please return to the emergency department for recheck/reevaluation. Otherwise please follow-up with your primary care physician 1-2 days.    Abdominal Pain, Adult Many things can cause belly (abdominal) pain. Most times, the belly pain is not dangerous. Many cases of belly pain can be watched and treated at home. HOME CARE   Do not take medicines that help you go poop (laxatives) unless told to by your doctor.  Only take medicine as told by your doctor.  Eat or drink as told by your doctor. Your doctor will tell you if you should be on a special diet. GET HELP IF:  You do not know what is causing your belly pain.  You have belly pain while you are sick to your stomach (nauseous) or have runny poop (diarrhea).  You have pain while you pee or poop.  Your belly pain wakes you up at night.  You have belly pain that gets worse or better when you eat.  You have belly pain that gets worse when you eat fatty foods.  You have a fever. GET HELP RIGHT AWAY IF:   The pain does not go away within 2 hours.  You keep throwing up (vomiting).  The pain changes and is only in the right or left part of the belly.  You have bloody or tarry looking poop. MAKE SURE YOU:   Understand these instructions.  Will watch your condition.  Will get help right away if you are not doing well or get worse.   This information is not intended to replace advice given to you by your health care provider. Make sure you discuss any questions you have with your health care provider.   Document Released: 03/24/2008 Document Revised: 10/27/2014 Document Reviewed: 06/15/2013 Elsevier Interactive Patient Education 2016 Elsevier Inc.  Urinary Tract  Infection Urinary tract infections (UTIs) can develop anywhere along your urinary tract. Your urinary tract is your body's drainage system for removing wastes and extra water. Your urinary tract includes two kidneys, two ureters, a bladder, and a urethra. Your kidneys are a pair of bean-shaped organs. Each kidney is about the size of your fist. They are located below your ribs, one on each side of your spine. CAUSES Infections are caused by microbes, which are microscopic organisms, including fungi, viruses, and bacteria. These organisms are so small that they can only be seen through a microscope. Bacteria are the microbes that most commonly cause UTIs. SYMPTOMS  Symptoms of UTIs may vary by age and gender of the patient and by the location of the infection. Symptoms in young women typically include a frequent and intense urge to urinate and a painful, burning feeling in the bladder or urethra during urination. Older women and men are more likely to be tired, shaky, and weak and have muscle aches and abdominal pain. A fever may mean the infection is in your kidneys. Other symptoms of a kidney infection include pain in your back or sides below the ribs, nausea, and vomiting. DIAGNOSIS To diagnose a UTI, your caregiver will ask you about your symptoms. Your caregiver will also ask you to provide a urine sample. The urine sample will be tested for bacteria and white blood cells. White blood cells are made by your  body to help fight infection. TREATMENT  Typically, UTIs can be treated with medication. Because most UTIs are caused by a bacterial infection, they usually can be treated with the use of antibiotics. The choice of antibiotic and length of treatment depend on your symptoms and the type of bacteria causing your infection. HOME CARE INSTRUCTIONS  If you were prescribed antibiotics, take them exactly as your caregiver instructs you. Finish the medication even if you feel better after you have only  taken some of the medication.  Drink enough water and fluids to keep your urine clear or pale yellow.  Avoid caffeine, tea, and carbonated beverages. They tend to irritate your bladder.  Empty your bladder often. Avoid holding urine for long periods of time.  Empty your bladder before and after sexual intercourse.  After a bowel movement, women should cleanse from front to back. Use each tissue only once. SEEK MEDICAL CARE IF:   You have back pain.  You develop a fever.  Your symptoms do not begin to resolve within 3 days. SEEK IMMEDIATE MEDICAL CARE IF:   You have severe back pain or lower abdominal pain.  You develop chills.  You have nausea or vomiting.  You have continued burning or discomfort with urination. MAKE SURE YOU:   Understand these instructions.  Will watch your condition.  Will get help right away if you are not doing well or get worse.   This information is not intended to replace advice given to you by your health care provider. Make sure you discuss any questions you have with your health care provider.   Document Released: 07/16/2005 Document Revised: 06/27/2015 Document Reviewed: 11/14/2011 Elsevier Interactive Patient Education [employer]2016 Elsevier Inc.

## 2016-02-25 NOTE — ED Provider Notes (Signed)
[facility] Emergency Department Provider Note  Time seen: 7:19 PM  I have reviewed the triage vital signs and the nursing notes.   HISTORY  Chief Complaint Abdominal Pain    HPI Kari Bailey is a 30 y.o. female with a past medical history of anxiety and depression presents the emergency department for lower abdominal discomfort. According to the patient since 2:00 this morning she has been having lower abdominal discomfort which she describes as mild to moderate. She states is a pressure type feeling. States the feeling worsens when she urinates describes a pressure sensation in her lower abdomen when urinating but denies any burning. Denies any vaginal discharge or bleeding. Denies nausea, vomiting, diarrhea. States she does have a mild amount of abdominal pain in her epigastrium but states this is fairly chronic which she believes is due to her gastric reflux.     Past Medical History  Diagnosis Date  . Mental disorder     anxiety  . Substance abuse affecting pregnancy in third trimester, antepartum   . Anxiety   . Depression   . Preterm labor     Patient Active Problem List   Diagnosis Date Noted  . Indication for care in labor and delivery, delivered 08/26/2015  . Substance abuse affecting pregnancy in third trimester, antepartum 08/16/2015  . Depression 08/16/2015  . Tobacco use disorder 08/16/2015  . Abdominal pain 05/15/2015    Past Surgical History  Procedure Laterality Date  . No past surgeries      Current Outpatient Rx  Name  Route  Sig  Dispense  Refill  . ibuprofen (ADVIL,MOTRIN) 600 MG tablet   Oral   Take 1 tablet (600 mg total) by mouth every 6 (six) hours as needed for moderate pain (almost every day).   30 tablet   0   . Prenatal Vit-Fe Fumarate-FA (MULTIVITAMIN-PRENATAL) 27-0.8 MG TABS tablet   Oral   Take 1 tablet by mouth daily at 12 noon.         . sertraline (ZOLOFT) 25 MG tablet   Oral   Take 25 mg by  mouth at bedtime.           Allergies Penicillins  No family history on file.  Social History Social History  Substance Use Topics  . Smoking status: Current Every Day Smoker -- 0.50 packs/day for 15 years    Types: Cigarettes  . Smokeless tobacco: Never Used  . Alcohol Use: Yes     Comment: irregular use.  occasional binge etoh    Review of Systems Constitutional: Negative for fever. Cardiovascular: Negative for chest pain. Respiratory: Negative for shortness of breath. Gastrointestinal: Positive for lower abdominal pain/pressure. Negative for nausea, vomiting, diarrhea Genitourinary: Negative for dysuria. Musculoskeletal: Negative for back pain. Neurological: Negative for headache 10-point ROS otherwise negative.  ____________________________________________   PHYSICAL EXAM:  VITAL SIGNS: ED Triage Vitals  Enc Vitals Group     BP --      Pulse Rate 02/25/16 1757 89     Resp 02/25/16 1757 20     Temp 02/25/16 1757 98.4 F (36.9 C)     Temp Source 02/25/16 1757 Oral     SpO2 02/25/16 1757 99 %     Weight 02/25/16 1757 116 lb (52.617 kg)     Height 02/25/16 1757  (1.6 m)     Head Cir --      Peak Flow --      Pain Score 02/25/16 1759 10  Pain Loc --      Pain Edu? --      Excl. in GC? --     Constitutional: Alert and oriented. Well appearing and in no distress. Eyes: Normal exam ENT   Head: Normocephalic and atraumatic   Mouth/Throat: Mucous membranes are moist. Cardiovascular: Normal rate, regular rhythm. No murmur Respiratory: Normal respiratory effort without tachypnea nor retractions. Breath sounds are clear  Gastrointestinal: Soft, mild epigastric tenderness palpation without rebound or guarding. Moderate suprapubic tenderness palpation. No distention. No CVA tenderness. Musculoskeletal: Nontender with normal range of motion in all extremities.  Neurologic:  Normal speech and language. No gross focal neurologic deficits Skin:  Skin  is warm, dry and intact.  Psychiatric: Mood and affect are normal.   ____________________________________________   INITIAL IMPRESSION / ASSESSMENT AND PLAN / ED COURSE  Pertinent labs & imaging results that were available during my care of the patient were reviewed by me and considered in my medical decision making (see chart for details).  Patient presents the emergency department left lower abdominal discomfort, pressure when urinating. States it feels like urinary tract infection she has had the past. Patient's labs are consistent with a urinary tract infection. Denies vaginal bleeding or discharge. Patient also has mild epigastric tenderness, she states significant reflux for which she takes Tums. I discussed with the patient her laboratory findings. A trial of antibiotics I will send a urine culture, we'll also prescribe a short course of Ultram and Pyridium. I discussed with the patient if symptoms do not improve in the next 48 hours or if they worsen at any time or she develops a fever she is to return to the emergency department for further evaluation. Patient is agreeable to this plan.  ____________________________________________   FINAL CLINICAL IMPRESSION(S) / ED DIAGNOSES  Lower abdominal pain Urinary tract infection   Minna AntisKevin Candence Sease, MD 02/25/16 Ernestina [id number]

## 2016-03-28 ENCOUNTER — Emergency Department
Admission: EM | Admit: 2016-03-28 | Discharge: 2016-03-28 | Disposition: A | Discharge status: Home / Self Care | Payer: Medicaid Other | Attending: Emergency Medicine | Admitting: Emergency Medicine

## 2016-03-28 ENCOUNTER — Encounter: Payer: Self-pay | Admitting: Emergency Medicine

## 2016-03-28 DIAGNOSIS — Z792 Long term (current) use of antibiotics: Secondary | ICD-10-CM | POA: Insufficient documentation

## 2016-03-28 DIAGNOSIS — F329 Major depressive disorder, single episode, unspecified: Secondary | ICD-10-CM | POA: Insufficient documentation

## 2016-03-28 DIAGNOSIS — N390 Urinary tract infection, site not specified: Secondary | ICD-10-CM

## 2016-03-28 DIAGNOSIS — F17210 Nicotine dependence, cigarettes, uncomplicated: Secondary | ICD-10-CM | POA: Insufficient documentation

## 2016-03-28 DIAGNOSIS — Z79899 Other long term (current) drug therapy: Secondary | ICD-10-CM | POA: Insufficient documentation

## 2016-03-28 DIAGNOSIS — J069 Acute upper respiratory infection, unspecified: Secondary | ICD-10-CM

## 2016-03-28 DIAGNOSIS — F1290 Cannabis use, unspecified, uncomplicated: Secondary | ICD-10-CM | POA: Insufficient documentation

## 2016-03-28 DIAGNOSIS — F1490 Cocaine use, unspecified, uncomplicated: Secondary | ICD-10-CM | POA: Insufficient documentation

## 2016-03-28 LAB — URINALYSIS COMPLETE WITH MICROSCOPIC (ARMC ONLY)
Bilirubin Urine: NEGATIVE
GLUCOSE, UA: NEGATIVE mg/dL
Ketones, ur: NEGATIVE mg/dL
Nitrite: NEGATIVE
PROTEIN: NEGATIVE mg/dL
SPECIFIC GRAVITY, URINE: 1.016 (ref 1.005–1.030)
pH: 6 (ref 5.0–8.0)

## 2016-03-28 LAB — POCT PREGNANCY, URINE: PREG TEST UR: NEGATIVE

## 2016-03-28 MED ORDER — PHENAZOPYRIDINE HCL 200 MG PO TABS: 200.0000 mg | ORAL_TABLET | Freq: Three times a day (TID) | ORAL | Status: AC | PRN

## 2016-03-28 MED ORDER — IBUPROFEN 600 MG PO TABS: 600.0000 mg | ORAL_TABLET | Freq: Three times a day (TID) | ORAL | Status: AC | PRN

## 2016-03-28 MED ORDER — IBUPROFEN 800 MG PO TABS
800.0000 mg | ORAL_TABLET | Freq: Once | ORAL | Status: AC
  Administered 2016-03-28: 800 mg via ORAL

## 2016-03-28 MED ORDER — IBUPROFEN 800 MG PO TABS
ORAL_TABLET | ORAL | Status: AC
  Administered 2016-03-28: 800 mg via ORAL
  Filled 2016-03-28: qty 1

## 2016-03-28 MED ORDER — SULFAMETHOXAZOLE-TRIMETHOPRIM 800-160 MG PO TABS
1.0000 | ORAL_TABLET | Freq: Two times a day (BID) | ORAL | Status: AC
Start: 2016-03-28 — End: ?

## 2016-03-28 MED ORDER — PSEUDOEPHEDRINE HCL 30 MG PO TABS: 30.0000 mg | ORAL_TABLET | Freq: Four times a day (QID) | ORAL | Status: AC | PRN

## 2016-03-28 NOTE — Discharge Instructions (Signed)
Upper Respiratory Infection, Adult Most upper respiratory infections (URIs) are a viral infection of the air passages leading to the lungs. A URI affects the nose, throat, and upper air passages. The most common type of URI is nasopharyngitis and is typically referred to as "the common cold." URIs run their course and usually go away on their own. Most of the time, a URI does not require medical attention, but sometimes a bacterial infection in the upper airways can follow a viral infection. This is called a secondary infection. Sinus and middle ear infections are common types of secondary upper respiratory infections. Bacterial pneumonia can also complicate a URI. A URI can worsen asthma and chronic obstructive pulmonary disease (COPD). Sometimes, these complications can require emergency medical care and may be life threatening.  CAUSES Almost all URIs are caused by viruses. A virus is a type of germ and can spread from one person to another.  RISKS FACTORS You may be at risk for a URI if:   You smoke.   You have chronic heart or lung disease.  You have a weakened defense (immune) system.   You are very young or very old.   You have nasal allergies or asthma.  You work in crowded or poorly ventilated areas.  You work in health care facilities or schools. SIGNS AND SYMPTOMS  Symptoms typically develop 2-3 days after you come in contact with a cold virus. Most viral URIs last 7-10 days. However, viral URIs from the influenza virus (flu virus) can last 14-18 days and are typically more severe. Symptoms may include:   Runny or stuffy (congested) nose.   Sneezing.   Cough.   Sore throat.   Headache.   Fatigue.   Fever.   Loss of appetite.   Pain in your forehead, behind your eyes, and over your cheekbones (sinus pain).  Muscle aches.  DIAGNOSIS  Your health care provider may diagnose a URI by:  Physical exam.  Tests to check that your symptoms are not due to  another condition such as:  Strep throat.  Sinusitis.  Pneumonia.  Asthma. TREATMENT  A URI goes away on its own with time. It cannot be cured with medicines, but medicines may be prescribed or recommended to relieve symptoms. Medicines may help:  Reduce your fever.  Reduce your cough.  Relieve nasal congestion. HOME CARE INSTRUCTIONS   Take medicines only as directed by your health care provider.   Gargle warm saltwater or take cough drops to comfort your throat as directed by your health care provider.  Use a warm mist humidifier or inhale steam from a shower to increase air moisture. This may make it easier to breathe.  Drink enough fluid to keep your urine clear or pale yellow.   Eat soups and other clear broths and maintain good nutrition.   Rest as needed.   Return to work when your temperature has returned to normal or as your health care provider advises. You may need to stay home longer to avoid infecting others. You can also use a face mask and careful hand washing to prevent spread of the virus.  Increase the usage of your inhaler if you have asthma.   Do not use any tobacco products, including cigarettes, chewing tobacco, or electronic cigarettes. If you need help quitting, ask your health care provider. PREVENTION  The best way to protect yourself from getting a cold is to practice good hygiene.   Avoid oral or hand contact with people with cold   symptoms.   Wash your hands often if contact occurs.  There is no clear evidence that vitamin C, vitamin E, echinacea, or exercise reduces the chance of developing a cold. However, it is always recommended to get plenty of rest, exercise, and practice good nutrition.  SEEK MEDICAL CARE IF:   You are getting worse rather than better.   Your symptoms are not controlled by medicine.   You have chills.  You have worsening shortness of breath.  You have brown or red mucus.  You have yellow or brown nasal  discharge.  You have pain in your face, especially when you bend forward.  You have a fever.  You have swollen neck glands.  You have pain while swallowing.  You have white areas in the back of your throat. SEEK IMMEDIATE MEDICAL CARE IF:   You have severe or persistent:  Headache.  Ear pain.  Sinus pain.  Chest pain.  You have chronic lung disease and any of the following:  Wheezing.  Prolonged cough.  Coughing up blood.  A change in your usual mucus.  You have a stiff neck.  You have changes in your:  Vision.  Hearing.  Thinking.  Mood. MAKE SURE YOU:   Understand these instructions.  Will watch your condition.  Will get help right away if you are not doing well or get worse.   This information is not intended to replace advice given to you by your health care provider. Make sure you discuss any questions you have with your health care provider.   Document Released: 04/01/2001 Document Revised: 02/20/2015 Document Reviewed: 01/11/2014 Elsevier Interactive Patient Education 2016 [employer]  

## 2016-03-28 NOTE — ED Provider Notes (Addendum)
[facility] Emergency Department Provider Note        Time seen: ----------------------------------------- 4:10 PM on 03/28/2016 -----------------------------------------    I have reviewed the triage vital signs and the nursing notes.   HISTORY  Chief Complaint Recurrent Sinusitis and Abdominal Pain    HPI Kari Bailey is a 30 y.o. female who presents to ER with green nasal drainage, left ear pain and abdominal cramping. Patient states 2 days ago she had an episode of diarrhea. She reports being here 3 weeks, and having a UTI. She denies fevers chills or other complaints. She has had green sputum production.   Past Medical History  Diagnosis Date  . Mental disorder     anxiety  . Substance abuse affecting pregnancy in third trimester, antepartum   . Anxiety   . Depression   . Preterm labor     Patient Active Problem List   Diagnosis Date Noted  . Indication for care in labor and delivery, delivered 08/26/2015  . Substance abuse affecting pregnancy in third trimester, antepartum 08/16/2015  . Depression 08/16/2015  . Tobacco use disorder 08/16/2015  . Abdominal pain 05/15/2015    Past Surgical History  Procedure Laterality Date  . No past surgeries      Allergies Penicillins  Social History Social History  Substance Use Topics  . Smoking status: Current Every Day Smoker -- 0.50 packs/day for 15 years    Types: Cigarettes  . Smokeless tobacco: Never Used  . Alcohol Use: Yes     Comment: irregular use.  occasional binge etoh    Review of Systems Constitutional: Negative for fever. Eyes: Negative for visual changes. ENT: Positive for sore throat, nasal drainage and congestion, left ear pain Cardiovascular: Negative for chest pain. Respiratory: Negative for shortness of breath. Gastrointestinal: Positive for abdominal pain, recent diarrhea Genitourinary: Negative for dysuria. Musculoskeletal: Negative for back pain. Skin:  Negative for rash. Neurological: Negative for headaches, focal weakness or numbness.  10-point ROS otherwise negative.  ____________________________________________   PHYSICAL EXAM:  VITAL SIGNS: ED Triage Vitals  Enc Vitals Group     BP 03/28/16 1528 142/82 mmHg     Pulse Rate 03/28/16 1528 122     Resp 03/28/16 1528 18     Temp 03/28/16 1528 98.2 F (36.8 C)     Temp Source 03/28/16 1528 Oral     SpO2 03/28/16 1528 98 %     Weight 03/28/16 1528 113 lb (51.256 kg)     Height 03/28/16 1528  (1.6 m)     Head Cir --      Peak Flow --      Pain Score 03/28/16 1538 10     Pain Loc --      Pain Edu? --      Excl. in GC? --     Constitutional: Alert and oriented. Well appearing and in no distress. Eyes: Conjunctivae are normal. PERRL. Normal extraocular movements. ENT   Head: Normocephalic and atraumatic.   Nose: No congestion/rhinnorhea.   Mouth/Throat: Mucous membranes are moist.   Neck: No stridor. Cardiovascular: Normal rate, regular rhythm. No murmurs, rubs, or gallops. Respiratory: Normal respiratory effort without tachypnea nor retractions. Breath sounds are clear and equal bilaterally. No wheezes/rales/rhonchi. Gastrointestinal: Soft and nontender. Normal bowel sounds Musculoskeletal: Nontender with normal range of motion in all extremities. No lower extremity tenderness nor edema. Neurologic:  Normal speech and language. No gross focal neurologic deficits are appreciated.  Skin:  Skin is warm, dry  and intact. No rash noted. Psychiatric: Mood and affect are normal. Speech and behavior are normal.  ____________________________________________  ED COURSE:  Pertinent labs & imaging results that were available during my care of the patient were reviewed by me and considered in my medical decision making (see chart for details). Patient is no acute distress, likely URI. We will assess her urinalysis and  reevaluate. ____________________________________________    LABS (pertinent positives/negatives)  Labs Reviewed  URINALYSIS COMPLETEWITH MICROSCOPIC ([facility] ONLY) - Abnormal; Notable for the following:    Color, Urine YELLOW (*)    APPearance CLOUDY (*)    Hgb urine dipstick 2+ (*)    Leukocytes, UA 3+ (*)    Bacteria, UA FEW (*)    Squamous Epithelial / LPF 6-30 (*)    All other components within normal limits  POCT PREGNANCY, URINE  POC URINE PREG, ED   ____________________________________________  FINAL ASSESSMENT AND PLAN  URI, UTI  Plan: Patient with labs as dictated above. Patient presents to the ER with vague abdominal pain and a URI. Patient appears to have a UTI as well as upper respiratory infection. I will advise decongestants, ibuprofen and follow-up with her primary care doctor for reevaluation.   Emily FilbertWilliams, Jonathan E, MD   Note: This dictation was prepared with Dragon dictation. Any transcriptional errors that result from this process are unintentional   Emily FilbertJonathan E Williams, MD 03/28/16 1659  Emily FilbertJonathan E Williams, MD 03/28/16 [phone]

## 2016-03-28 NOTE — ED Notes (Signed)
Pt presents to ED with green nasal drainage, left ear pain and abdominal cramping. Pt reports two days ago she had an episode of diarrhea.

## 2016-10-08 ENCOUNTER — Emergency Department
Admission: EM | Admit: 2016-10-08 | Discharge: 2016-10-08 | Disposition: A | Discharge status: Home / Self Care | Payer: Self-pay | Attending: Emergency Medicine | Admitting: Emergency Medicine

## 2016-10-08 ENCOUNTER — Emergency Department: Payer: Self-pay

## 2016-10-08 ENCOUNTER — Encounter: Payer: Self-pay | Admitting: Emergency Medicine

## 2016-10-08 DIAGNOSIS — R109 Unspecified abdominal pain: Secondary | ICD-10-CM

## 2016-10-08 DIAGNOSIS — F17210 Nicotine dependence, cigarettes, uncomplicated: Secondary | ICD-10-CM | POA: Insufficient documentation

## 2016-10-08 DIAGNOSIS — N200 Calculus of kidney: Secondary | ICD-10-CM | POA: Insufficient documentation

## 2016-10-08 LAB — CBC
HCT: 45 % (ref 35.0–47.0)
Hemoglobin: 15.8 g/dL (ref 12.0–16.0)
MCH: 30.5 pg (ref 26.0–34.0)
MCHC: 35 g/dL (ref 32.0–36.0)
MCV: 87 fL (ref 80.0–100.0)
Platelets: 356 10*3/uL (ref 150–440)
RBC: 5.17 MIL/uL (ref 3.80–5.20)
RDW: 14.1 % (ref 11.5–14.5)
WBC: 11.3 10*3/uL — AB (ref 3.6–11.0)

## 2016-10-08 LAB — BASIC METABOLIC PANEL
ANION GAP: 9 (ref 5–15)
BUN: 10 mg/dL (ref 6–20)
CALCIUM: 9.7 mg/dL (ref 8.9–10.3)
CO2: 24 mmol/L (ref 22–32)
Chloride: 106 mmol/L (ref 101–111)
Creatinine, Ser: 0.61 mg/dL (ref 0.44–1.00)
GFR calc Af Amer: >60 mL/min (ref >60)
GLUCOSE: 102 mg/dL — AB (ref 65–99)
Potassium: 3.6 mmol/L (ref 3.5–5.1)
SODIUM: 139 mmol/L (ref 135–145)

## 2016-10-08 LAB — URINALYSIS, COMPLETE (UACMP) WITH MICROSCOPIC
Bilirubin Urine: NEGATIVE
GLUCOSE, UA: NEGATIVE mg/dL
KETONES UR: 80 mg/dL — AB
Nitrite: NEGATIVE
PROTEIN: 100 mg/dL — AB
Specific Gravity, Urine: 1.025 (ref 1.005–1.030)
pH: 5 (ref 5.0–8.0)

## 2016-10-08 LAB — POCT PREGNANCY, URINE: PREG TEST UR: NEGATIVE

## 2016-10-08 MED ORDER — ONDANSETRON 4 MG PO TBDP
4.0000 mg | ORAL_TABLET | Freq: Once | ORAL | Status: AC
  Administered 2016-10-08: 4 mg via ORAL
  Filled 2016-10-08: qty 1

## 2016-10-08 MED ORDER — TRAMADOL HCL 50 MG PO TABS
ORAL_TABLET | ORAL | Status: DC
Start: 2016-10-08 — End: 2016-10-08
  Filled 2016-10-08: qty 1

## 2016-10-08 MED ORDER — MORPHINE SULFATE (PF) 4 MG/ML IV SOLN
4.0000 mg | Freq: Once | INTRAVENOUS | Status: AC
  Administered 2016-10-08: 4 mg via INTRAMUSCULAR
  Filled 2016-10-08: qty 1

## 2016-10-08 MED ORDER — TRAMADOL HCL 50 MG PO TABS
50.0000 mg | ORAL_TABLET | Freq: Once | ORAL | Status: AC
  Administered 2016-10-08: 50 mg via ORAL

## 2016-10-08 MED ORDER — TRAMADOL HCL 50 MG PO TABS: 50.0000 mg | ORAL_TABLET | Freq: Four times a day (QID) | ORAL | 0 refills | Status: AC | PRN

## 2016-10-08 NOTE — ED Notes (Signed)

## 2016-10-08 NOTE — ED Notes (Signed)
Pt complains of bilateral flank pain with tiredness, pt reports decrease in appetite and fluid intake

## 2016-10-08 NOTE — ED Provider Notes (Signed)
[facility] Emergency Department Provider Note  Time seen: 3:23 PM  I have reviewed the triage vital signs and the nursing notes.   HISTORY  Chief Complaint Flank Pain    HPI Kari Bailey is a 30 y.o. female who presents to the emergency department with left flank pain. According to the patient since last night she has been experiencing sharp pain to her left back. Denies any dysuria. States she is currently on her period so she does not know if she is having hematuria. Denies any history of kidney stones. Denies any fever, nausea or vomiting or diarrhea. Denies any cough or congestion. Patient continues to state sharp left flank pain 7/10 currently.  Past Medical History:  Diagnosis Date  . Anxiety   . Depression   . Mental disorder    anxiety  . Preterm labor   . Substance abuse affecting pregnancy in third trimester, antepartum     Patient Active Problem List   Diagnosis Date Noted  . Indication for care in labor and delivery, delivered 08/26/2015  . Substance abuse affecting pregnancy in third trimester, antepartum 08/16/2015  . Depression 08/16/2015  . Tobacco use disorder 08/16/2015  . Abdominal pain 05/15/2015    Past Surgical History:  Procedure Laterality Date  . NO PAST SURGERIES      Prior to Admission medications   Medication Sig Start Date End Date Taking? Authorizing Provider  cephALEXin (KEFLEX) 500 MG capsule Take 1 capsule (500 mg total) by mouth 3 (three) times daily. 02/25/16   Minna AntisKevin Danahi Reddish, MD  ibuprofen (ADVIL,MOTRIN) 600 MG tablet Take 1 tablet (600 mg total) by mouth every 6 (six) hours as needed for moderate pain (almost every day). 08/27/15   Vena AustriaAndreas Staebler, MD  ibuprofen (ADVIL,MOTRIN) 600 MG tablet Take 1 tablet (600 mg total) by mouth every 8 (eight) hours as needed. 03/28/16   Emily FilbertJonathan E Williams, MD  phenazopyridine (PYRIDIUM) 200 MG tablet Take 1 tablet (200 mg total) by mouth 3 (three) times daily as needed for  pain. 02/25/16 02/24/17  Minna AntisKevin Shakora Nordquist, MD  phenazopyridine (PYRIDIUM) 200 MG tablet Take 1 tablet (200 mg total) by mouth 3 (three) times daily as needed for pain. 03/28/16 03/28/17  Emily FilbertJonathan E Williams, MD  Prenatal Vit-Fe Fumarate-FA (MULTIVITAMIN-PRENATAL) 27-0.8 MG TABS tablet Take 1 tablet by mouth daily at 12 noon.    Historical Provider, MD  pseudoephedrine (SUDAFED) 30 MG tablet Take 1 tablet (30 mg total) by mouth every 6 (six) hours as needed for congestion. 03/28/16   Emily FilbertJonathan E Williams, MD  sertraline (ZOLOFT) 25 MG tablet Take 25 mg by mouth at bedtime.    Historical Provider, MD  sulfamethoxazole-trimethoprim (BACTRIM DS) 800-160 MG tablet Take 1 tablet by mouth 2 (two) times daily. 03/28/16   Emily FilbertJonathan E Williams, MD  traMADol (ULTRAM) 50 MG tablet Take 1 tablet (50 mg total) by mouth every 6 (six) hours as needed. 02/25/16 02/24/17  Minna AntisKevin Kjuan Seipp, MD    Allergies  Allergen Reactions  . Penicillins Rash    History reviewed. No pertinent family history.  Social History Social History  Substance Use Topics  . Smoking status: Current Every Day Smoker    Packs/day: 0.50    Years: 15.00    Types: Cigarettes  . Smokeless tobacco: Never Used  . Alcohol use Yes     Comment: irregular use.  occasional binge etoh    Review of Systems Constitutional: Negative for fever. Cardiovascular: Negative for chest pain. Respiratory: Negative for shortness of  breath. Gastrointestinal: Left flank/back pain. Negative for nausea, vomiting, diarrhea. Genitourinary: Negative for dysuria. Negative for discharge. Unknown hematuria. Currently on her period. Musculoskeletal: Left back pain Neurological: Negative for headache 10-point ROS otherwise negative.  ____________________________________________   PHYSICAL EXAM:  VITAL SIGNS: ED Triage Vitals  Enc Vitals Group     BP 10/08/16 1337 128/89     Pulse Rate 10/08/16 1337 99     Resp 10/08/16 1337 18     Temp 10/08/16 1337 98 F (36.7 C)      Temp Source 10/08/16 1337 Oral     SpO2 10/08/16 1337 96 %     Weight 10/08/16 1337 102 lb (46.3 kg)     Height 10/08/16 1337 5\' 3"  (1.6 m)     Head Circumference --      Peak Flow --      Pain Score 10/08/16 1339 9     Pain Loc --      Pain Edu? --      Excl. in GC? --     Constitutional: Alert and oriented. Well appearing and in no distress. Eyes: Normal exam ENT   Head: Normocephalic and atraumatic.   Mouth/Throat: Mucous membranes are moist. Cardiovascular: Normal rate, regular rhythm. No murmur Respiratory: Normal respiratory effort without tachypnea nor retractions. Breath sounds are clear Gastrointestinal: Soft, nontender, no distention. Mild left CVA tenderness palpation. No right CVA tenderness. Musculoskeletal: Nontender with normal range of motion in all extremities.  Neurologic:  Normal speech and language. No gross focal neurologic deficits  Skin:  Skin is warm, dry and intact.  Psychiatric: Mood and affect are normal.   ____________________________________________    INITIAL IMPRESSION / ASSESSMENT AND PLAN / ED COURSE  Pertinent labs & imaging results that were available during my care of the patient were reviewed by me and considered in my medical decision making (see chart for details).  The patient presents to the emergency department with left flank pain 7/10, sharp. Denies history of kidney stones in the past. States the pain started last night. Patient's lab workup is largely within normal limits besides a slight leukocytosis, too numerous to count red blood cells in her urinalysis however she is currently on her period. We will dose pain and nausea medication, obtain a CT scan to further evaluate. Patient agreeable plan.  CT consistent with nephrolithiasis without hydronephrosis. No ureteral lithiasis.  We will discharge the patient was short course of Ultram and PCP follow-up. I discussed with normal abdominal pain return  precautions.  ____________________________________________   FINAL CLINICAL IMPRESSION(S) / ED DIAGNOSES  Left flank pain Renal colic   Minna AntisKevin Rosa Wyly, MD 10/08/16 [id number]

## 2016-10-08 NOTE — Discharge Instructions (Signed)
Please take your pain medication as needed, as written. Please follow-up with a primary care doctor in 2-3 days for recheck/reevaluation. Return to the emergency department for any increased pain, fever, or any other symptom personally concerning to yourself.

## 2016-10-08 NOTE — ED Triage Notes (Signed)
Pt presents to ED c/o bil mid back pain. States it started last night and is worse this morning.

## 2016-10-18 ENCOUNTER — Emergency Department
Admission: EM | Admit: 2016-10-18 | Discharge: 2016-10-18 | Disposition: A | Discharge status: Home / Self Care | Payer: Medicaid Other | Attending: Emergency Medicine | Admitting: Emergency Medicine

## 2016-10-18 DIAGNOSIS — F1910 Other psychoactive substance abuse, uncomplicated: Secondary | ICD-10-CM | POA: Insufficient documentation

## 2016-10-18 DIAGNOSIS — F17210 Nicotine dependence, cigarettes, uncomplicated: Secondary | ICD-10-CM | POA: Insufficient documentation

## 2016-10-18 DIAGNOSIS — Z79899 Other long term (current) drug therapy: Secondary | ICD-10-CM | POA: Insufficient documentation

## 2016-10-18 DIAGNOSIS — F329 Major depressive disorder, single episode, unspecified: Secondary | ICD-10-CM

## 2016-10-18 DIAGNOSIS — F32A Depression, unspecified: Secondary | ICD-10-CM

## 2016-10-18 LAB — URINALYSIS, ROUTINE W REFLEX MICROSCOPIC
Bilirubin Urine: NEGATIVE
Glucose, UA: NEGATIVE mg/dL
KETONES UR: 5 mg/dL — AB
Nitrite: NEGATIVE
PROTEIN: 100 mg/dL — AB
Specific Gravity, Urine: 1.015 (ref 1.005–1.030)
pH: 5 (ref 5.0–8.0)

## 2016-10-18 LAB — POCT PREGNANCY, URINE: PREG TEST UR: NEGATIVE

## 2016-10-18 MED ORDER — CEPHALEXIN 500 MG PO CAPS: 500.0000 mg | ORAL_CAPSULE | Freq: Two times a day (BID) | ORAL | 0 refills | Status: AC

## 2016-10-18 NOTE — ED Notes (Signed)
BEHAVIORAL HEALTH ROUNDING  Patient sleeping: No.  Patient alert and oriented: yes  Behavior appropriate: Yes. ; If no, describe:  Nutrition and fluids offered: Yes  Toileting and hygiene offered: Yes  Sitter present: not applicable, Q 15 min safety rounds and observation.  Law enforcement present: Yes ODS  

## 2016-10-18 NOTE — ED Triage Notes (Signed)
Pt states that her dad died this year,states that she had an unexpected child, states that she needs help emotionally, states that she is attempting to dig herself out of a pit, pt states that she has been sleeping where ever she can lay her head

## 2016-10-18 NOTE — BH Assessment (Signed)
Assessment Note  Kari Bailey is an 30 y.o. female presenting to the ED, voluntarily,  with concerns of depression and crack addiction. Ppatient  having a hard time coping with multiple ife stressors: death of father last year, unexpected pregnancy last, not having stable housing, and crack addiction.  She states she uses crack to hlp her cope with her depression.  She reports using polysubstances and last used cocaine yesterday. She denies any recent narcotic usage.   Patient denies SI/HI and any auditory/visual hallucinations.  She denies any other drug or alcohol use.   Disposition:  Patient will be discharged with outpatient resources.  Informed patient that [facility] is not a provider under her current insurance.  Referred patient to [facility] but pt will need to verify her insurance information as our records indicate pt has Medicaid.  Diagnosis: Drug addiction  Past Medical History:  Past Medical History:  Diagnosis Date  . Anxiety   . Depression   . Mental disorder    anxiety  . Preterm labor   . Substance abuse affecting pregnancy in third trimester, antepartum     Past Surgical History:  Procedure Laterality Date  . NO PAST SURGERIES      Family History: No family history on file.  Social History:  reports that she has been smoking Cigarettes.  She has a 7.50 pack-year smoking history. She has never used smokeless tobacco. She reports that she drinks alcohol. She reports that she uses drugs, including Cocaine and Marijuana, about 1 time per week.  Additional Social History:  Alcohol / Drug Use Pain Medications: None reported Prescriptions: None reported Over the Counter: None reported History of alcohol / drug use?: Yes Longest period of sobriety (when/how long): "can't remember" Negative Consequences of Use: Financial, Legal, Personal relationships, Work / School Substance #1 Name of Substance 1: Crack 1 - Age of First Use: 21 1 - Amount (size/oz): varies 1 -  Frequency: daily 1 - Duration: varies 1 - Last Use / Amount: yesterday  CIWA: CIWA-Ar BP: 137/86 Pulse Rate: (!) 103 COWS:    Allergies:  Allergies  Allergen Reactions  . Penicillins Rash    Home Medications:  (Not in a hospital admission)  OB/GYN Status:  Patient's last menstrual period was 10/08/2016 (exact date).  General Assessment Data [facility] Assessment: In system Is this a Tele or Face-to-Face Assessment?: Face-to-Face Is this an Initial Assessment or a Re-assessment for this encounter?: Initial Assessment Marital status: Single Maiden name: n/a Is patient pregnant?: No Pregnancy Status: No Living Arrangements: Other (Comment) Can pt return to current living arrangement?: Yes Admission Status: Voluntary Is patient capable of signing voluntary admission?: Yes Referral Source: Self/Family/Friend Insurance type: unknown  Medical Screening Exam [facility] Walk-in ONLY) Medical Exam completed: Yes  Crisis Care Plan Living Arrangements: Other (Comment) Legal Guardian: Other: (self) Name of Psychiatrist: none provided Name of Therapist: none provided  Education Status Is patient currently in school?: No Current Grade: na Highest grade of school patient has completed: na Name of school: na Contact person: na  Risk to self with the past 6 months Suicidal Ideation: No Has patient been a risk to self within the past 6 months prior to admission? : No Suicidal Intent: No Has patient had any suicidal intent within the past 6 months prior to admission? : No Is patient at risk for suicide?: No Suicidal Plan?: No Has patient had any suicidal plan within the past 6 months prior to admission? :  No Access to Means: Yes Specify Access to Suicidal Means: Pt has access to illegal drugs What has been your use of drugs/alcohol within the last 12 months?: crack Previous Attempts/Gestures: No How many times?: 0 Other Self Harm Risks: drug  addiction Triggers for Past Attempts: None known Intentional Self Injurious Behavior: None Family Suicide History: No Recent stressful life event(s): Loss (Comment), Other (Comment) (death of father, unexpected pregnancy) Persecutory voices/beliefs?: No Depression: Yes Depression Symptoms: Loss of interest in usual pleasures, Feeling worthless/self pity, Tearfulness Substance abuse history and/or treatment for substance abuse?: Yes Suicide prevention information given to non-admitted patients: Not applicable  Risk to Others within the past 6 months Homicidal Ideation: No Does patient have any lifetime risk of violence toward others beyond the six months prior to admission? : No Thoughts of Harm to Others: No Current Homicidal Intent: No Current Homicidal Plan: No Access to Homicidal Means: No Identified Victim: none identified History of harm to others?: No Assessment of Violence: None Noted Violent Behavior Description: none identified Does patient have access to weapons?: No Criminal Charges Pending?: No Does patient have a court date: No Is patient on probation?: No  Psychosis Hallucinations: None noted Delusions: None noted  Mental Status Report Appearance/Hygiene: In scrubs Eye Contact: Good Motor Activity: Freedom of movement Speech: Logical/coherent Level of Consciousness: Alert Mood: Depressed, Helpless, Sad, Pleasant Affect: Appropriate to circumstance, Depressed, Sad Anxiety Level: Minimal Thought Processes: Coherent, Relevant Judgement: Unimpaired Orientation: Person, Place, Time, Situation Obsessive Compulsive Thoughts/Behaviors: None  Cognitive Functioning Concentration: Normal Memory: Recent Intact, Remote Intact IQ: Average Insight: Fair Impulse Control: Fair Appetite: Fair Weight Loss: 0 Weight Gain: 0 Sleep: No Change Vegetative Symptoms: None  ADLScreening [facility] Assessment Services) Patient's cognitive ability adequate to safely complete  daily activities?: Yes Patient able to express need for assistance with ADLs?: Yes Independently performs ADLs?: Yes (appropriate for developmental age)  Prior Inpatient Therapy Prior Inpatient Therapy: No Prior Therapy Dates: na Prior Therapy Facilty/Provider(s): na Reason for Treatment: na  Prior Outpatient Therapy Prior Outpatient Therapy: No Prior Therapy Dates: na Prior Therapy Facilty/Provider(s): na Reason for Treatment: na Does patient have an ACCT team?: No Does patient have Intensive In-House Services?  : No Does patient have [facility] services? : No Does patient have P4[facility] services?: No  ADL Screening (condition at time of admission) Patient's cognitive ability adequate to safely complete daily activities?: Yes Patient able to express need for assistance with ADLs?: Yes Independently performs ADLs?: Yes (appropriate for developmental age)       Abuse/Neglect Assessment (Assessment to be complete while patient is alone) Physical Abuse: Denies Verbal Abuse: Denies Sexual Abuse: Denies Exploitation of patient/patient's resources: Denies Self-Neglect: Denies Values / Beliefs Cultural Requests During Hospitalization: None Spiritual Requests During Hospitalization: None Consults Spiritual Care Consult Needed: No Social Work Consult Needed: No Merchant navy officerAdvance Directives (For Healthcare) Does Patient Have a Medical Advance Directive?: No Would patient like information on creating a medical advance directive?: No - Patient declined    Additional Information 1:1 In Past 12 Months?: No CIRT Risk: No Elopement Risk: No Does patient have medical clearance?: Yes     Disposition:  Disposition Initial Assessment Completed for this Encounter: Yes Disposition of Patient: Outpatient treatment Type of outpatient treatment: Chemical Dependence - Intensive Outpatient (Pt provided referrals for opt resources)  On Site Evaluation by:   Reviewed with Physician:    Artist Beachoxana C  Stanlee Roehrig 10/18/2016 8:35 PM

## 2016-10-18 NOTE — Discharge Instructions (Signed)
Return to the emergency department or seek immediate psychiatric evaluation if he feels suicidal, homicidal, or hallucinations. Please follow up with the services provided by our social services. Please avoid listed substances and he can always seek outpatient alcohol anonymous or narcotic anonymous follow-up. You should see a psychiatrist before restarting on antidepressant medications.

## 2016-10-18 NOTE — ED Provider Notes (Signed)
Time Seen: Approximately *1942  I have reviewed the triage notes  Chief Complaint: Depression   History of Present Illness: Kari Bailey is a 30 y.o. female who presents with recent onset of depression. The patient states that she's having a hard time coping with current life stressors. Patient states she abuses polysubstances and last used cocaine yesterday. She denies any recent narcotic usage. Patient was recently seen and evaluated here for renal colic. She is requesting evaluation for outpatient treatment for depression and polysubstance abuse. Patient has had a history of kind of harm herself but currently denies any suicidal thoughts, homicidal thoughts, hallucinations.   Past Medical History:  Diagnosis Date  . Anxiety   . Depression   . Mental disorder    anxiety  . Preterm labor   . Substance abuse affecting pregnancy in third trimester, antepartum     Patient Active Problem List   Diagnosis Date Noted  . Indication for care in labor and delivery, delivered 08/26/2015  . Substance abuse affecting pregnancy in third trimester, antepartum 08/16/2015  . Depression 08/16/2015  . Tobacco use disorder 08/16/2015  . Abdominal pain 05/15/2015    Past Surgical History:  Procedure Laterality Date  . NO PAST SURGERIES      Past Surgical History:  Procedure Laterality Date  . NO PAST SURGERIES      Current Outpatient Rx  . Order #: [id number] Class: Print  . Order #: [id number] Class: Normal  . Order #: [id number] Class: Print  . Order #: [id number] Class: Print  . Order #: [id number] Class: Print  . Order #: [id number] Class: Historical Med  . Order #: [id number] Class: Print  . Order #: [id number] Class: Historical Med  . Order #: [id number] Class: Print  . Order #: [id number] Class: Print    Allergies:  Penicillins  Family History: No family history on file.  Social History: Social History  Substance Use Topics  . Smoking status: Current Every Day Smoker   Packs/day: 0.50    Years: 15.00    Types: Cigarettes  . Smokeless tobacco: Never Used  . Alcohol use Yes     Comment: irregular use.  occasional binge etoh     Review of Systems:   10 point review of systems was performed and was otherwise negative:  Constitutional: No fever Eyes: No visual disturbances ENT: No sore throat, ear pain Cardiac: No chest pain Respiratory: No shortness of breath, wheezing, or stridor Abdomen: No abdominal pain, no vomiting, No diarrhea Endocrine: No weight loss, No night sweats Extremities: No peripheral edema, cyanosis Skin: No rashes, easy bruising Neurologic: No focal weakness, trouble with speech or swollowing Urologic: No dysuria, Hematuria, or urinary frequency   Physical Exam:  ED Triage Vitals [10/18/16 1728]  Enc Vitals Group     BP 137/86     Pulse Rate (!) 103     Resp 18     Temp 98.5 F (36.9 C)     Temp Source Oral     SpO2 96 %     Weight 103 lb (46.7 kg)     Height 5\' 4"  (1.626 m)     Head Circumference      Peak Flow      Pain Score 8     Pain Loc      Pain Edu?      Excl. in GC?     General: Awake , Alert , and Oriented times 3; GCS 15 Patient is cooperative and answering all questions appropriately. Head: Normal cephalic , atraumatic Eyes: Pupils  equal , round, reactive to light Nose/Throat: No nasal drainage, patent upper airway without erythema or exudate.  Neck: Supple, Full range of motion, No anterior adenopathy or palpable thyroid masses Lungs: Clear to ascultation without wheezes , rhonchi, or rales Heart: Regular rate, regular rhythm without murmurs , gallops , or rubs Abdomen: Soft, non tender without rebound, guarding , or rigidity; bowel sounds positive and symmetric in all 4 quadrants. No organomegaly .        Extremities: 2 plus symmetric pulses. No edema, clubbing or cyanosis. Old scars on the wrist for self abuse Neurologic: normal ambulation, Motor symmetric without deficits, sensory intact Skin:  warm, dry, no rashes No fresh lesions  Labs:   All laboratory work was reviewed including any pertinent negatives or positives listed below:  Labs Reviewed  URINALYSIS, ROUTINE W REFLEX MICROSCOPIC - Abnormal; Notable for the following:       Result Value   Color, Urine YELLOW (*)    APPearance CLOUDY (*)    Hgb urine dipstick SMALL (*)    Ketones, ur 5 (*)    Protein, ur 100 (*)    Leukocytes, UA LARGE (*)    Bacteria, UA FEW (*)    Squamous Epithelial / LPF TOO NUMEROUS TO COUNT (*)    All other components within normal limits  URINE CULTURE  POCT PREGNANCY, URINE  Repeat laboratory work shows some hematuria with white blood cells count are also present  ED Course:  Patient's currently hemodynamically stable. On initial and repeat questioning she continues to deny any suicidal thoughts, homicidal thoughts or hallucinations. Patient was seen by TTS services and provided with outpatient substance abuse programs and also psychiatric referral. We felt that the patient should see a psychiatrist prior to initiation of antidepressant therapy. She states she was on Celexa in the past remotely but has not been on any medication recently. She was given resources and referral to local treatment programs. She states that she is not suffering from any symptoms consistent with renal colic at this time which she was recently evaluated for here in emergency department. Her urine shows some findings of white blood cell is present so I added a urine culture and I felt we should start the patient on some Keflex that she's been on before in the past. Clinical Course      Assessment:  Depression Substance abuse Urinary tract infection (possible)  Final Clinical Impression:   Final diagnoses:  Depression, unspecified depression type     Plan:  Outpatient Patient was advised to return immediately if condition worsens. Patient was advised to follow up with their primary care physician or other  specialized physicians involved in their outpatient care. The patient and/or family member/power of attorney had laboratory results reviewed at the bedside. All questions and concerns were addressed and appropriate discharge instructions were distributed by the nursing staff.             Jennye MoccasinBrian S Quigley, MD 10/18/16 2028

## 2016-10-18 NOTE — ED Notes (Signed)

## 2016-10-18 NOTE — ED Notes (Signed)
Pt. Dressed out by this tech and Diplomatic Services operational officerAngela RN. Belongings consisting of clothes, phone, and jewelry (Wedding band and body piercing's) sent home with patients brother, Laban EmperorDarrell.

## 2016-10-20 LAB — URINE CULTURE

## 2017-02-03 ENCOUNTER — Emergency Department
Admission: EM | Admit: 2017-02-03 | Discharge: 2017-02-03 | Disposition: A | Discharge status: Home / Self Care | Payer: No Typology Code available for payment source | Attending: Emergency Medicine | Admitting: Emergency Medicine

## 2017-02-03 ENCOUNTER — Encounter: Payer: Self-pay | Admitting: Emergency Medicine

## 2017-02-03 DIAGNOSIS — F17210 Nicotine dependence, cigarettes, uncomplicated: Secondary | ICD-10-CM | POA: Insufficient documentation

## 2017-02-03 DIAGNOSIS — R059 Cough, unspecified: Secondary | ICD-10-CM

## 2017-02-03 DIAGNOSIS — R05 Cough: Secondary | ICD-10-CM

## 2017-02-03 DIAGNOSIS — J069 Acute upper respiratory infection, unspecified: Secondary | ICD-10-CM

## 2017-02-03 DIAGNOSIS — J111 Influenza due to unidentified influenza virus with other respiratory manifestations: Secondary | ICD-10-CM

## 2017-02-03 DIAGNOSIS — R69 Illness, unspecified: Secondary | ICD-10-CM

## 2017-02-03 NOTE — Discharge Instructions (Signed)
Take over the counter cold and flu medicine for symptoms. Take Tylenol and Ibuprofen for body aches. Take in plenty of fluids. Return to the Emergency Department if symptoms do not improve.

## 2017-02-03 NOTE — ED Triage Notes (Signed)
Cough, fever, body aches x 2 days.  Recently  Treated for PID at [facility].

## 2017-02-03 NOTE — ED Provider Notes (Signed)
[facility]-EMERGENCY DEPARTMENT Provider Note   CSN: [id number] Arrival date & time: 02/03/17  1616     History   Chief Complaint Chief Complaint  Patient presents with  . Cough  . Generalized Body Aches    HPI Kari Bailey is a 31 y.o. female presents to the emergency department for evaluation of cough congestion runny nose sore throat and body aches. Symptoms were sudden onset 2 days ago. Known contact with influenza. She denies any fevers. No vomiting or diarrhea. No chest pain or shortness of breath. No wheezing. She has not been taking Any medications for her symptoms.  The history is provided by the patient.    Past Medical History:  Diagnosis Date  . Anxiety   . Depression   . Mental disorder    anxiety  . Preterm labor   . Substance abuse affecting pregnancy in third trimester, antepartum     Patient Active Problem List   Diagnosis Date Noted  . Indication for care in labor and delivery, delivered 08/26/2015  . Substance abuse affecting pregnancy in third trimester, antepartum 08/16/2015  . Depression 08/16/2015  . Tobacco use disorder 08/16/2015  . Abdominal pain 05/15/2015    Past Surgical History:  Procedure Laterality Date  . NO PAST SURGERIES      OB History    Gravida Para Term Preterm AB Living   0 3   SAB TAB Ectopic Multiple Live Births   0 0 0 0 3       Home Medications    Prior to Admission medications   Medication Sig Start Date End Date Taking? Authorizing Provider  ibuprofen (ADVIL,MOTRIN) 600 MG tablet Take 1 tablet (600 mg total) by mouth every 6 (six) hours as needed for moderate pain (almost every day). 08/27/15   Vena Austria, MD  ibuprofen (ADVIL,MOTRIN) 600 MG tablet Take 1 tablet (600 mg total) by mouth every 8 (eight) hours as needed. 03/28/16   Emily Filbert, MD  phenazopyridine (PYRIDIUM) 200 MG tablet Take 1 tablet (200 mg total) by mouth 3 (three) times daily as needed for pain. 02/25/16 02/24/17  Minna Antis, MD  phenazopyridine (PYRIDIUM) 200 MG tablet Take 1 tablet (200 mg total) by mouth 3 (three) times daily as needed for pain. 03/28/16 03/28/17  Emily Filbert, MD  Prenatal Vit-Fe Fumarate-FA (MULTIVITAMIN-PRENATAL) 27-0.8 MG TABS tablet Take 1 tablet by mouth daily at 12 noon.    Historical Provider, MD  pseudoephedrine (SUDAFED) 30 MG tablet Take 1 tablet (30 mg total) by mouth every 6 (six) hours as needed for congestion. 03/28/16   Emily Filbert, MD  sertraline (ZOLOFT) 25 MG tablet Take 25 mg by mouth at bedtime.    Historical Provider, MD  sulfamethoxazole-trimethoprim (BACTRIM DS) 800-160 MG tablet Take 1 tablet by mouth 2 (two) times daily. 03/28/16   Emily Filbert, MD  traMADol (ULTRAM) 50 MG tablet Take 1 tablet (50 mg total) by mouth every 6 (six) hours as needed. 10/08/16   Minna Antis, MD    Family History No family history on file.  Social History Social History  Substance Use Topics  . Smoking status: Current Every Day Smoker    Packs/day: 0.50    Years: 15.00    Types: Cigarettes  . Smokeless tobacco: Never Used  . Alcohol use Yes     Comment: irregular use.  occasional binge etoh     Allergies   Penicillins   Review of Systems Review of  Systems  Constitutional: Positive for fatigue. Negative for activity change, chills and fever.  HENT: Positive for ear pain, rhinorrhea and sore throat. Negative for congestion and sinus pressure.   Eyes: Negative for visual disturbance.  Respiratory: Positive for cough. Negative for chest tightness and shortness of breath.   Cardiovascular: Negative for chest pain and leg swelling.  Gastrointestinal: Negative for abdominal pain, diarrhea, nausea and vomiting.  Genitourinary: Negative for dysuria.  Musculoskeletal: Negative for arthralgias and gait problem.  Skin: Negative for rash.  Neurological: Negative for weakness, numbness and headaches.  Hematological: Negative for adenopathy.    Psychiatric/Behavioral: Negative for agitation, behavioral problems and confusion.     Physical Exam Updated Vital Signs BP 110/66 (BP Location: Left Arm)   Pulse 84   Temp 98.5 F (36.9 C) (Oral)   Resp 16   Ht  (1.6 m)   Wt 52.2 kg   LMP 01/26/2017   SpO2 98%   BMI 20.37 kg/m   Physical Exam  Constitutional: She is oriented to person, place, and time. She appears well-developed and well-nourished. No distress.  HENT:  Head: Normocephalic and atraumatic.  Right Ear: External ear normal.  Left Ear: Tympanic membrane and external ear normal.  Mouth/Throat: Oropharynx is clear and moist. No oropharyngeal exudate.  Eyes: EOM are normal. Pupils are equal, round, and reactive to light. Right eye exhibits no discharge. Left eye exhibits no discharge.  Neck: Normal range of motion. Neck supple.  Cardiovascular: Normal rate, regular rhythm and intact distal pulses.   Pulmonary/Chest: Effort normal and breath sounds normal. No respiratory distress. She exhibits no tenderness.  Abdominal: Soft. She exhibits no distension. There is no tenderness. There is no guarding.  Musculoskeletal: Normal range of motion. She exhibits no edema.  Lymphadenopathy:    She has cervical adenopathy (posterior cervical).  Neurological: She is alert and oriented to person, place, and time. She has normal reflexes.  Skin: Skin is warm and dry.  Psychiatric: She has a normal mood and affect. Her behavior is normal. Thought content normal.     ED Treatments / Results  Labs (all labs ordered are listed, but only abnormal results are displayed) Labs Reviewed - No data to display  EKG  EKG Interpretation None       Radiology No results found.  Procedures Procedures (including critical care time)  Medications Ordered in ED Medications - No data to display   Initial Impression / Assessment and Plan / ED Course  I have reviewed the triage vital signs and the nursing notes.  Pertinent  labs & imaging results that were available during my care of the patient were reviewed by me and considered in my medical decision making (see chart for details).     31 year old female with 2 day onset of viral illness consistent with possible influenza. She is educated on signs and symptoms return to the ED for. She will take over-the-counter cold and flu medication as well as Tylenol and ibuprofen as needed for body aches. She will increase her fluids. No more than 4000 mg of Tylenol daily.  Final Clinical Impressions(s) / ED Diagnoses   Final diagnoses:  Cough  Viral upper respiratory tract infection  Influenza-like illness    New Prescriptions New Prescriptions   No medications on file     Evon Slack, PA-C 02/03/17 1837    Merrily Brittle, MD 02/04/17 [phone]

## 2018-08-30 ENCOUNTER — Emergency Department
Admission: EM | Admit: 2018-08-30 | Discharge: 2018-08-31 | Disposition: A | Discharge status: Home / Self Care | Payer: Medicaid Other | Attending: Emergency Medicine | Admitting: Emergency Medicine

## 2018-08-30 ENCOUNTER — Emergency Department: Payer: Medicaid Other

## 2018-08-30 ENCOUNTER — Other Ambulatory Visit: Payer: Self-pay

## 2018-08-30 DIAGNOSIS — B9689 Other specified bacterial agents as the cause of diseases classified elsewhere: Secondary | ICD-10-CM

## 2018-08-30 DIAGNOSIS — A749 Chlamydial infection, unspecified: Secondary | ICD-10-CM | POA: Diagnosis not present

## 2018-08-30 DIAGNOSIS — N760 Acute vaginitis: Secondary | ICD-10-CM | POA: Insufficient documentation

## 2018-08-30 DIAGNOSIS — Z79899 Other long term (current) drug therapy: Secondary | ICD-10-CM | POA: Diagnosis not present

## 2018-08-30 DIAGNOSIS — N83201 Unspecified ovarian cyst, right side: Secondary | ICD-10-CM | POA: Insufficient documentation

## 2018-08-30 DIAGNOSIS — R1084 Generalized abdominal pain: Secondary | ICD-10-CM

## 2018-08-30 DIAGNOSIS — F17210 Nicotine dependence, cigarettes, uncomplicated: Secondary | ICD-10-CM | POA: Diagnosis not present

## 2018-08-30 DIAGNOSIS — R102 Pelvic and perineal pain: Secondary | ICD-10-CM

## 2018-08-30 LAB — COMPREHENSIVE METABOLIC PANEL
ALK PHOS: 69 U/L (ref 38–126)
ALT: 15 U/L (ref 0–44)
AST: 21 U/L (ref 15–41)
Albumin: 4.3 g/dL (ref 3.5–5.0)
Anion gap: 10 (ref 5–15)
BUN: 7 mg/dL (ref 6–20)
CALCIUM: 9.5 mg/dL (ref 8.9–10.3)
CHLORIDE: 102 mmol/L (ref 98–111)
CO2: 23 mmol/L (ref 22–32)
CREATININE: 0.43 mg/dL — AB (ref 0.44–1.00)
GFR calc non Af Amer: >60 mL/min (ref >60)
Glucose, Bld: 95 mg/dL (ref 70–99)
Potassium: 4.2 mmol/L (ref 3.5–5.1)
SODIUM: 135 mmol/L (ref 135–145)
Total Bilirubin: 0.6 mg/dL (ref 0.3–1.2)
Total Protein: 7.7 g/dL (ref 6.5–8.1)

## 2018-08-30 LAB — LACTIC ACID, PLASMA: LACTIC ACID, VENOUS: 1 mmol/L (ref 0.5–1.9)

## 2018-08-30 LAB — WET PREP, GENITAL
Sperm: NONE SEEN
Trich, Wet Prep: NONE SEEN
YEAST WET PREP: NONE SEEN

## 2018-08-30 LAB — URINALYSIS, COMPLETE (UACMP) WITH MICROSCOPIC
BILIRUBIN URINE: NEGATIVE
Bacteria, UA: NONE SEEN
GLUCOSE, UA: NEGATIVE mg/dL
Ketones, ur: NEGATIVE mg/dL
Leukocytes, UA: NEGATIVE
NITRITE: NEGATIVE
PH: 6 (ref 5.0–8.0)
Protein, ur: NEGATIVE mg/dL
SPECIFIC GRAVITY, URINE: 1.002 — AB (ref 1.005–1.030)
Squamous Epithelial / LPF: NONE SEEN (ref 0–5)
WBC UA: NONE SEEN WBC/hpf (ref 0–5)

## 2018-08-30 LAB — LIPASE, BLOOD: LIPASE: 26 U/L (ref 11–51)

## 2018-08-30 LAB — CBC
HEMATOCRIT: 46.1 % — AB (ref 36.0–46.0)
Hemoglobin: 15.5 g/dL — ABNORMAL HIGH (ref 12.0–15.0)
MCH: 30.8 pg (ref 26.0–34.0)
MCHC: 33.6 g/dL (ref 30.0–36.0)
MCV: 91.5 fL (ref 80.0–100.0)
NRBC: 0 % (ref 0.0–0.2)
Platelets: 300 10*3/uL (ref 150–400)
RBC: 5.04 MIL/uL (ref 3.87–5.11)
RDW: 12.5 % (ref 11.5–15.5)
WBC: 19.5 10*3/uL — ABNORMAL HIGH (ref 4.0–10.5)

## 2018-08-30 LAB — POCT PREGNANCY, URINE: PREG TEST UR: NEGATIVE

## 2018-08-30 MED ORDER — SODIUM CHLORIDE 0.9 % IV BOLUS
1000.0000 mL | Freq: Once | INTRAVENOUS | Status: AC
  Administered 2018-08-30: 1000 mL via INTRAVENOUS

## 2018-08-30 MED ORDER — ONDANSETRON HCL 4 MG/2ML IJ SOLN
4.0000 mg | Freq: Once | INTRAMUSCULAR | Status: AC
  Administered 2018-08-30: 4 mg via INTRAVENOUS
  Filled 2018-08-30: qty 2

## 2018-08-30 MED ORDER — MORPHINE SULFATE (PF) 4 MG/ML IV SOLN
4.0000 mg | Freq: Once | INTRAVENOUS | Status: AC
  Administered 2018-08-30: 4 mg via INTRAVENOUS
  Filled 2018-08-30: qty 1

## 2018-08-30 MED ORDER — IOPAMIDOL (ISOVUE-300) INJECTION 61%
100.0000 mL | Freq: Once | INTRAVENOUS | Status: AC | PRN
  Administered 2018-08-30: 100 mL via INTRAVENOUS

## 2018-08-30 NOTE — ED Triage Notes (Signed)
Pt c/o intermittent generalized abd pain with nausea that started today denies vomiting or diarrhea.

## 2018-08-30 NOTE — ED Notes (Signed)
Spoke with pt about wait times and what to expect next. Advised pt that I am available for further questions if needed.  

## 2018-08-30 NOTE — ED Provider Notes (Signed)
[facility] Emergency Department Provider Note  ____________________________________________  Time seen: Approximately 7:48 PM  I have reviewed the triage vital signs and the nursing notes.   HISTORY  Chief Complaint Abdominal Pain    HPI Kari Bailey is a 32 y.o. female who presents to the emergency department for treatment and evaluation of abdominal pain that started acutely this morning after getting out of bed. Pain is intermittent and is "in the middle" of her stomach.  No alleviating measures attempted prior to arrival.  Past Medical History:  Diagnosis Date  . Anxiety   . Depression   . Mental disorder    anxiety  . Preterm labor   . Substance abuse affecting pregnancy in third trimester, antepartum     Patient Active Problem List   Diagnosis Date Noted  . Indication for care in labor and delivery, delivered 08/26/2015  . Substance abuse affecting pregnancy in third trimester, antepartum 08/16/2015  . Depression 08/16/2015  . Tobacco use disorder 08/16/2015  . Abdominal pain 05/15/2015    Past Surgical History:  Procedure Laterality Date  . NO PAST SURGERIES      Prior to Admission medications   Medication Sig Start Date End Date Taking? Authorizing Provider  ibuprofen (ADVIL,MOTRIN) 600 MG tablet Take 1 tablet (600 mg total) by mouth every 6 (six) hours as needed for moderate pain (almost every day). 08/27/15   Vena Austria, MD  ibuprofen (ADVIL,MOTRIN) 600 MG tablet Take 1 tablet (600 mg total) by mouth every 8 (eight) hours as needed. 03/28/16   Emily Filbert, MD  Prenatal Vit-Fe Fumarate-FA (MULTIVITAMIN-PRENATAL) 27-0.8 MG TABS tablet Take 1 tablet by mouth daily at 12 noon.    [provider]  pseudoephedrine (SUDAFED) 30 MG tablet Take 1 tablet (30 mg total) by mouth every 6 (six) hours as needed for congestion. 03/28/16   Emily Filbert, MD  sertraline (ZOLOFT) 25 MG tablet Take 25 mg by mouth at bedtime.     [provider]  sulfamethoxazole-trimethoprim (BACTRIM DS) 800-160 MG tablet Take 1 tablet by mouth 2 (two) times daily. 03/28/16   Emily Filbert, MD  traMADol (ULTRAM) 50 MG tablet Take 1 tablet (50 mg total) by mouth every 6 (six) hours as needed. 10/08/16   Minna Antis, MD    Allergies Penicillins  No family history on file.  Social History Social History   Tobacco Use  . Smoking status: Current Every Day Smoker    Packs/day: 0.50    Years: 15.00    Pack years: 7.50    Types: Cigarettes  . Smokeless tobacco: Never Used  Substance Use Topics  . Alcohol use: Yes    Comment: irregular use.  occasional binge etoh  . Drug use: Yes    Frequency: 1.0 times per week    Types: Cocaine, Marijuana    Comment: last cocaine use yesterday    Review of Systems Constitutional: Negative for fever. Respiratory: Negative for shortness of breath or cough. Gastrointestinal: Positive for abdominal pain; positive for nausea , negative for vomiting. Genitourinary: Negative for dysuria , negative for vaginal discharge. Musculoskeletal: Negative for back pain. Skin: Negative for acute skin changes/rash/lesion. ____________________________________________   PHYSICAL EXAM:  VITAL SIGNS: ED Triage Vitals  Enc Vitals Group     BP 08/30/18 1635 (!) 135/99     Pulse Rate 08/30/18 1635 77     Resp 08/30/18 1635 17     Temp 08/30/18 1635 98.1 F (36.7 C)  Temp Source 08/30/18 1635 Oral     SpO2 08/30/18 1635 99 %     Weight 08/30/18 1636 135 lb (61.2 kg)     Height 08/30/18 1636 5\' 3"  (1.6 m)     Head Circumference --      Peak Flow --      Pain Score 08/30/18 1636 10     Pain Loc --      Pain Edu? --      Excl. in GC? --     Constitutional: Alert and oriented. Well appearing and in no acute distress. Eyes: Conjunctivae are normal. Head: Atraumatic. Nose: No congestion/rhinnorhea. Mouth/Throat: Mucous membranes are moist. Respiratory: Normal respiratory  effort.  No retractions. Gastrointestinal: Bowel sounds active x 4; Abdomen is soft without rebound or guarding. Genitourinary: Pelvic exam: No cervical motion tenderness on exam.  No adnexal tenderness.  Cervix closed.  Discharge noted at the cervical os. Musculoskeletal: No extremity tenderness nor edema.  Neurologic:  Normal speech and language. No gross focal neurologic deficits are appreciated. Speech is normal. No gait instability. Skin:  Skin is warm, dry and intact. No rash noted on exposed skin. Psychiatric: Mood and affect are normal. Speech and behavior are normal.  ____________________________________________   LABS (all labs ordered are listed, but only abnormal results are displayed)  Labs Reviewed  CHLAMYDIA/NGC RT PCR ([facility] ONLY) - Abnormal; Notable for the following components:      Result Value   Chlamydia Tr DETECTED (*)    All other components within normal limits  WET PREP, GENITAL - Abnormal; Notable for the following components:   Clue Cells Wet Prep HPF POC PRESENT (*)    WBC, Wet Prep HPF POC MANY (*)    All other components within normal limits  COMPREHENSIVE METABOLIC PANEL - Abnormal; Notable for the following components:   Creatinine, Ser 0.43 (*)    All other components within normal limits  CBC - Abnormal; Notable for the following components:   WBC 19.5 (*)    Hemoglobin 15.5 (*)    HCT 46.1 (*)    All other components within normal limits  URINALYSIS, COMPLETE (UACMP) WITH MICROSCOPIC - Abnormal; Notable for the following components:   Color, Urine COLORLESS (*)    APPearance CLEAR (*)    Specific Gravity, Urine 1.002 (*)    Hgb urine dipstick SMALL (*)    All other components within normal limits  LIPASE, BLOOD  LACTIC ACID, PLASMA  POC URINE PREG, ED  POCT PREGNANCY, URINE   ____________________________________________  RADIOLOGY  CT of the abdomen shows a concern for a right adnexal mass/questionable ovarian torsion.   Otherwise, CT is negative for acute findings.  Doppler ultrasound shows a complex 2.8 cm cyst at the right ovary with peripheral thickening and soft tissue nodularity.  No torsion. ____________________________________________  Procedures  ____________________________________________  32 year old female presenting to the emergency department for treatment and evaluation of abdominal pain with nausea.  CT of the abdomen and pelvis has been ordered and the patient will be given fluids and pain meds.  CT indicates need for Doppler ultrasound to rule out ovarian torsion.  Ultrasound has been ordered.  Patient's pain has decreased.  She states that she is hungry but she was advised to wait until after ultrasound to eat.  Lab studies are concerning with a white blood cell count of 19 and in light of the ultrasound being negative for ovarian torsion and the CT negative for any acute findings there is no  source for patient's pain or leukocytosis.  Pelvic exam to be completed.  Patient is positive for chlamydia and bacterial vaginosis.  Do not think this is PID as there is no cervical motion tenderness and the patient does not have any pain when she ambulates.  She was given 1 g of azithromycin and 2 g of Flagyl.  She was advised that she should avoid intercourse for at least the next week and have any partners tested/treated before having intercourse with them.  She is to call and schedule an appointment with gynecology for further evaluation of the cystic structure on the right ovary.  She is to return to the ER for symptoms of change or worsen if she is unable to see primary care or gynecology.  INITIAL IMPRESSION / ASSESSMENT AND PLAN / ED COURSE  Pertinent labs & imaging results that were available during my care of the patient were reviewed by me and considered in my medical decision making (see chart for details).  ____________________________________________   FINAL CLINICAL IMPRESSION(S) / ED  DIAGNOSES  Final diagnoses:  Generalized abdominal pain  Chlamydia infection  Bacterial vaginosis  Cyst of right ovary    Note:  This document was prepared using Dragon voice recognition software and may include unintentional dictation errors.    Chinita Pester, FNP 08/31/18 0157    Myrna Blazer, MD 09/02/18 [phone]

## 2018-08-31 LAB — CHLAMYDIA/NGC RT PCR (ARMC ONLY)
CHLAMYDIA TR: DETECTED — AB
N GONORRHOEAE: NOT DETECTED

## 2018-08-31 MED ORDER — IBUPROFEN 600 MG PO TABS
ORAL_TABLET | ORAL | Status: AC
  Administered 2018-08-31: 600 mg via ORAL
  Filled 2018-08-31: qty 1

## 2018-08-31 MED ORDER — AZITHROMYCIN 500 MG PO TABS
1000.0000 mg | ORAL_TABLET | Freq: Once | ORAL | Status: AC
  Administered 2018-08-31: 1000 mg via ORAL
  Filled 2018-08-31: qty 2

## 2018-08-31 MED ORDER — METRONIDAZOLE 500 MG PO TABS
2000.0000 mg | ORAL_TABLET | Freq: Once | ORAL | Status: AC
  Administered 2018-08-31: 2000 mg via ORAL
  Filled 2018-08-31: qty 4

## 2018-08-31 MED ORDER — IBUPROFEN 600 MG PO TABS
600.0000 mg | ORAL_TABLET | Freq: Once | ORAL | Status: AC
  Administered 2018-08-31: 600 mg via ORAL

## 2018-08-31 NOTE — Discharge Instructions (Signed)
Your test was positive for chlamydia and bacterial vaginosis.  You have been treated but will need to wait at least 1 week before having intercourse.  Your partner(s) need to be tested/treated as well.  Please call and schedule a follow-up appointment with gynecology.  Return to the emergency department for symptoms that change or worsen if you are unable to schedule an appointment.

## 2018-09-03 ENCOUNTER — Encounter: Payer: Self-pay | Admitting: Obstetrics and Gynecology

## 2018-09-03 ENCOUNTER — Ambulatory Visit (INDEPENDENT_AMBULATORY_CARE_PROVIDER_SITE_OTHER): Payer: Medicaid Other | Admitting: Obstetrics and Gynecology

## 2018-09-03 VITALS — BP 118/74 | Wt 140.0 lb

## 2018-09-03 DIAGNOSIS — R1084 Generalized abdominal pain: Secondary | ICD-10-CM

## 2018-09-03 DIAGNOSIS — N83201 Unspecified ovarian cyst, right side: Secondary | ICD-10-CM | POA: Diagnosis not present

## 2018-09-03 NOTE — Progress Notes (Signed)
Obstetrics & Gynecology Office Visit   Chief Complaint  Patient presents with  . Follow-up  . Ovarian Cyst   History of Present Illness: 32 y.o. [id number] female who presents in follow up from an ER visit on 08/30/18.  She presented due to acute onset middle (top to bottom) abdominal pain. It would go away then come back and feel like someone was stabbing her.  She would have to double over. It did not radiate.  Alleviating factors: putting pressure on the area.  Aggravating factors: walking.  Associated symptoms: nausea without emesis and anorexia.  Denies diarrhea and constipation.  Denies hematuria and hematochezia.  She had this happen about a year ago.  She had trichomonas about 4 years ago for which she was hospitalized for about a week.  A year ago she had BV.  At the hospital she was diagnosed with chlamydia and BV. The provider treated her for chalmydia and BV (flagyl 2 grams once PO). Also noted was a right ovarian cyst measuring 2.8 x 1.9 x 1.6 cm that was called somewhat complex with peripheral thickening and soft tissue nodularity.  Today, the pain is much improved, but dull.  She states this is what has happened before.  However, if she gets up too fast, the pain will be a sharp pain on her right side.  Her last pap smear 3 years ago. She states that it was normal.   Past Medical History:  Diagnosis Date  . Anxiety   . Depression   . Mental disorder    anxiety  . Preterm labor   . Substance abuse affecting pregnancy in third trimester, antepartum     Past Surgical History:  Procedure Laterality Date  . NO PAST SURGERIES      Gynecologic History: Patient's last menstrual period was 08/13/2018 (approximate).  Obstetric History: G3P2103 G1 - SVD, 2006, boy G2 - SVD, 2014, girl G3 - SVD, preterm 2016, boy  Family History: mother with PCOS, father with liver cancer, MGM with cervical cancer.   Social History   Socioeconomic History  . Marital status: Single    Spouse  name: Not on file  . Number of children: 1  . Years of education: Not on file  . Highest education level: Not on file  Occupational History  . Not on file  Social Needs  . Financial resource strain: Not on file  . Food insecurity:    Worry: Not on file    Inability: Not on file  . Transportation needs:    Medical: Not on file    Non-medical: Not on file  Tobacco Use  . Smoking status: Current Every Day Smoker    Packs/day: 0.50    Years: 15.00    Pack years: 7.50    Types: Cigarettes  . Smokeless tobacco: Never Used  Substance and Sexual Activity  . Alcohol use: Yes    Comment: irregular use.  occasional binge etoh  . Drug use: Yes    Frequency: 1.0 times per week    Types: Cocaine, Marijuana    Comment: last cocaine use yesterday  . Sexual activity: Yes    Partners: Male    Birth control/protection: Condom, Injection  Lifestyle  . Physical activity:    Days per week: Not on file    Minutes per session: Not on file  . Stress: Not on file  Relationships  . Social connections:    Talks on phone: Not on file    Gets together: Not on file  Attends religious service: Not on file    Active member of club or organization: Not on file    Attends meetings of clubs or organizations: Not on file    Relationship status: Not on file  . Intimate partner violence:    Fear of current or ex partner: Not on file    Emotionally abused: Not on file    Physically abused: Not on file    Forced sexual activity: Not on file  Other Topics Concern  . Not on file  Social History Narrative   Single, 1st child not in her custody, has visitation    Allergies  Allergen Reactions  . Penicillins Rash    Prior to Admission medications   Medication Sig Start Date End Date Taking? Authorizing Provider  ibuprofen (ADVIL,MOTRIN) 600 MG tablet Take 1 tablet (600 mg total) by mouth every 6 (six) hours as needed for moderate pain (almost every day). 08/27/15  Yes Vena AustriaStaebler, Andreas, MD    ibuprofen (ADVIL,MOTRIN) 600 MG tablet Take 1 tablet (600 mg total) by mouth every 8 (eight) hours as needed. Patient not taking: Reported on 09/03/2018 03/28/16   Emily FilbertWilliams, Jonathan E, MD  Prenatal Vit-Fe Fumarate-FA (MULTIVITAMIN-PRENATAL) 27-0.8 MG TABS tablet Take 1 tablet by mouth daily at 12 noon.    [provider]  pseudoephedrine (SUDAFED) 30 MG tablet Take 1 tablet (30 mg total) by mouth every 6 (six) hours as needed for congestion. Patient not taking: Reported on 09/03/2018 03/28/16   Emily FilbertWilliams, Jonathan E, MD  sertraline (ZOLOFT) 25 MG tablet Take 25 mg by mouth at bedtime.    [provider]  sulfamethoxazole-trimethoprim (BACTRIM DS) 800-160 MG tablet Take 1 tablet by mouth 2 (two) times daily. Patient not taking: Reported on 09/03/2018 03/28/16   Emily FilbertWilliams, Jonathan E, MD  traMADol (ULTRAM) 50 MG tablet Take 1 tablet (50 mg total) by mouth every 6 (six) hours as needed. Patient not taking: Reported on 09/03/2018 10/08/16   Minna AntisPaduchowski, Kevin, MD    Review of Systems  Constitutional: Negative.   HENT: Negative.   Eyes: Negative.   Respiratory: Negative.   Cardiovascular: Negative.   Gastrointestinal: Negative.        See HPI  Genitourinary: Negative.   Musculoskeletal: Negative.   Skin: Negative.   Neurological: Negative.   Psychiatric/Behavioral: Negative.      Physical Exam BP 118/74   Wt 140 lb (63.5 kg)   LMP 08/13/2018 (Approximate)   BMI 24.80 kg/m  Patient's last menstrual period was 08/13/2018 (approximate). Physical Exam  Constitutional: She is oriented to person, place, and time. She appears well-developed and well-nourished. No distress.  Eyes: EOM are normal. No scleral icterus.  Neck: Normal range of motion. Neck supple.  Cardiovascular: Normal rate and regular rhythm.  Pulmonary/Chest: Effort normal and breath sounds normal. No respiratory distress. She has no wheezes. She has no rales.  Abdominal: Soft. Bowel sounds are normal. She  exhibits no distension and no mass. There is no tenderness. There is no rebound and no guarding.  Musculoskeletal: Normal range of motion. She exhibits no edema.  Neurological: She is alert and oriented to person, place, and time. No cranial nerve deficit.  Skin: Skin is warm and dry. No erythema.  Psychiatric: She has a normal mood and affect. Her behavior is normal. Judgment normal.    Assessment: 32 y.o. [id number] female here for  1. Right ovarian cyst   2. Generalized abdominal pain      Plan: Problem List Items Addressed This Visit  Other   Abdominal pain    Other Visit Diagnoses    Right ovarian cyst    -  Primary   Relevant Orders   US PELVIS TRANSVANGINAL NON-OB (TV ONLY)     Generalized abdominal pain: This seems to have resolved with treatment for chlamydia and bacterial vaginosis.  Given her initial presenting symptoms to the ER, she was given precautions for worsening abdominal and pelvic pain and to have a low threshold to return to clinic for full PID treatment.  Right ovarian cyst: The cyst was noted to be mildly complex.  I reviewed the images myself.  Given the mild complexity, I recommended to her a follow-up ultrasound in 8 to 12 weeks.  Health maintenance: The patient does not seem to get regular care.  I encouraged her to follow-up for her ultrasound and to have a routine annual exam at that time so that she could have a Pap smear, as she is at high risk for HPV given her multiple STDs over the last several years.  She voiced understanding and agreement.  30 minutes spent in face to face discussion with > 50% spent in counseling,management, and coordination of care of her right ovarian cyst and generalized abdominal pain.  Return in about 2 months (around 11/03/2018) for schedule pelvic u/s and annual with Dr. Jean Rosenthal.   Thomasene Mohair, MD 09/03/2018 11:33 AM

## 2018-10-04 ENCOUNTER — Other Ambulatory Visit: Payer: Medicaid Other

## 2018-10-04 ENCOUNTER — Ambulatory Visit: Payer: Medicaid Other | Admitting: Obstetrics and Gynecology

## 2018-10-18 ENCOUNTER — Ambulatory Visit: Payer: Medicaid Other | Admitting: Obstetrics and Gynecology

## 2018-10-18 ENCOUNTER — Ambulatory Visit: Payer: Medicaid Other
